# Patient Record
Sex: Female | Born: 1995 | Race: White | Hispanic: No | Marital: Single
Health system: Midwestern US, Academic
[De-identification: ages and names within clinical notes are randomized; demographics above are authoritative.]

## PROBLEM LIST (undated history)

## (undated) DIAGNOSIS — G43909 Migraine, unspecified, not intractable, without status migrainosus: Secondary | ICD-10-CM

## (undated) DIAGNOSIS — R1010 Upper abdominal pain, unspecified: Secondary | ICD-10-CM

## (undated) DIAGNOSIS — Z114 Encounter for screening for human immunodeficiency virus [HIV]: Secondary | ICD-10-CM

## (undated) DIAGNOSIS — Z0000 Encounter for general adult medical examination without abnormal findings: Principal | ICD-10-CM

## (undated) DIAGNOSIS — N3000 Acute cystitis without hematuria: Secondary | ICD-10-CM

## (undated) DIAGNOSIS — Z7689 Persons encountering health services in other specified circumstances: Secondary | ICD-10-CM

## (undated) DIAGNOSIS — Z9229 Personal history of other drug therapy: Secondary | ICD-10-CM

## (undated) DIAGNOSIS — J029 Acute pharyngitis, unspecified: Secondary | ICD-10-CM

## (undated) HISTORY — DX: Migraine, unspecified, not intractable, without status migrainosus: G43.909

## (undated) HISTORY — PX: NO PAST SURGERIES: SHX2092

## (undated) MED FILL — RIMEGEPANT 75 MG DISINTEGRATING TABLET: 75 75 mg | ORAL | 30 days supply | Qty: 8 | Fill #4

## (undated) MED FILL — FREMANEZUMAB-VFRM 225 MG/1.5 ML SUBCUTANEOUS AUTO-INJECTOR: 225 225 mg/1.5 mL | SUBCUTANEOUS | 30 days supply | Qty: 1.5 | Fill #0

## (undated) MED FILL — RIMEGEPANT 75 MG DISINTEGRATING TABLET: 75 75 mg | ORAL | 30 days supply | Qty: 8 | Fill #0

## (undated) MED FILL — FREMANEZUMAB-VFRM 225 MG/1.5 ML SUBCUTANEOUS AUTO-INJECTOR: 225 225 mg/1.5 mL | SUBCUTANEOUS | Qty: 1.5 | Fill #0

---

## 2018-11-30 ENCOUNTER — Other Ambulatory Visit: Payer: Self-pay

## 2018-11-30 ENCOUNTER — Encounter: Payer: Self-pay | Admitting: Family Medicine

## 2018-11-30 ENCOUNTER — Ambulatory Visit (INDEPENDENT_AMBULATORY_CARE_PROVIDER_SITE_OTHER): Payer: 59 | Admitting: Family Medicine

## 2018-11-30 VITALS — BP 118/60 | HR 86 | Wt 155.0 lb

## 2018-11-30 DIAGNOSIS — K625 Hemorrhage of anus and rectum: Secondary | ICD-10-CM | POA: Diagnosis not present

## 2018-11-30 DIAGNOSIS — Z114 Encounter for screening for human immunodeficiency virus [HIV]: Secondary | ICD-10-CM

## 2018-11-30 DIAGNOSIS — Z0000 Encounter for general adult medical examination without abnormal findings: Secondary | ICD-10-CM | POA: Diagnosis not present

## 2018-11-30 NOTE — Patient Instructions (Addendum)
It was great meeting you today!  I am glad things are going well.  In regards to the rectal bleeding I think this is more likely to be from possible hemorrhoids as opposed to an anal fissure.  Due to this  bleeding coupled with normal menstruation I will get a CBC to see if you have anemia.  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  No further testing is necessary at this time.  Please let me know if anything pops up.

## 2018-12-01 LAB — CBC WITH DIFFERENTIAL/PLATELET
Basophils Absolute: 0.1 10*3/uL (ref 0.0–0.2)
Basos: 1 %
EOS (ABSOLUTE): 0.1 10*3/uL (ref 0.0–0.4)
Eos: 2 %
Hematocrit: 37.5 % (ref 34.0–46.6)
Hemoglobin: 12.4 g/dL (ref 11.1–15.9)
Immature Grans (Abs): 0 10*3/uL (ref 0.0–0.1)
Immature Granulocytes: 0 %
Lymphocytes Absolute: 2.4 10*3/uL (ref 0.7–3.1)
Lymphs: 29 %
MCH: 29.1 pg (ref 26.6–33.0)
MCHC: 33.1 g/dL (ref 31.5–35.7)
MCV: 88 fL (ref 79–97)
Monocytes Absolute: 0.6 10*3/uL (ref 0.1–0.9)
Monocytes: 8 %
Neutrophils Absolute: 5 10*3/uL (ref 1.4–7.0)
Neutrophils: 60 %
Platelets: 272 10*3/uL (ref 150–450)
RBC: 4.26 x10E6/uL (ref 3.77–5.28)
RDW: 11.8 % (ref 11.7–15.4)
WBC: 8.2 10*3/uL (ref 3.4–10.8)

## 2018-12-01 LAB — HIV ANTIBODY (ROUTINE TESTING W REFLEX): HIV Screen 4th Generation wRfx: NONREACTIVE

## 2018-12-03 ENCOUNTER — Encounter: Payer: Self-pay | Admitting: Family Medicine

## 2018-12-03 DIAGNOSIS — K625 Hemorrhage of anus and rectum: Secondary | ICD-10-CM | POA: Insufficient documentation

## 2018-12-03 DIAGNOSIS — Z0000 Encounter for general adult medical examination without abnormal findings: Secondary | ICD-10-CM | POA: Insufficient documentation

## 2018-12-03 NOTE — Progress Notes (Signed)
  HPI:  Patient presents today for a new patient appointment to establish general primary care, also to discuss rectal bleeding.  Patient states that she has noticed some small drops of blood a couple of times in her stool over the last 6 months. Has never had painful bowel movements. As far as she knows it is normal consistency and she is not particularly constipated.  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 Cervical lymphadepathy Heart: rrr, no m/r/g. Skin warm and dry, palpable radial pulse bilaterally Lungs: lungs ctab, no accessory muscle use Abdomen: soft, nt, nd. Neuro: cn 2-12 intact, no focal neuro deficits  ASSESSMENT/PLAN:  # Health maintenance:  - getting hiv, will get pap smear at gyn office next week, tdap up to date  Encounter for health supervision hiv negative. Getting pap smear at gyn office. Up to date.  Rectal bleeding Likely secondary to internal hemorrhoids given the non-painful bleeding. Patient has not felt or noticed anything externally either. Encouraged increased water intake and high fiber diet. Cbc normal.     FOLLOW UP: Follow up in 1 year for annual wellness  Guadalupe Dawn MD PGY-3 Family Medicine Resident

## 2018-12-03 NOTE — Assessment & Plan Note (Signed)
hiv negative. Getting pap smear at gyn office. Up to date.

## 2018-12-03 NOTE — Assessment & Plan Note (Signed)
Likely secondary to internal hemorrhoids given the non-painful bleeding. Patient has not felt or noticed anything externally either. Encouraged increased water intake and high fiber diet. Cbc normal.

## 2018-12-04 ENCOUNTER — Telehealth: Payer: Self-pay | Admitting: Family Medicine

## 2018-12-04 NOTE — Telephone Encounter (Signed)
Informed patient of results.  .Maleta Pacha R Maronda Caison, CMA  

## 2018-12-04 NOTE — Telephone Encounter (Signed)
Please let the patient know that her blood counts look great and that her hiv came back negative.  Guadalupe Dawn MD PGY-3 Family Medicine Resident

## 2019-02-12 DIAGNOSIS — G43009 Migraine without aura, not intractable, without status migrainosus: Secondary | ICD-10-CM | POA: Insufficient documentation

## 2019-04-17 ENCOUNTER — Encounter: Payer: Self-pay | Admitting: Family Medicine

## 2019-05-25 ENCOUNTER — Telehealth: Payer: Self-pay | Admitting: *Deleted

## 2019-05-25 DIAGNOSIS — G43809 Other migraine, not intractable, without status migrainosus: Secondary | ICD-10-CM

## 2019-05-25 NOTE — Telephone Encounter (Signed)
Referral placed to [facility]. They should call her to schedule an appointment sometime in the next few weeks.  Myrene Buddy MD PGY-3 Family Medicine Resident

## 2019-05-25 NOTE — Telephone Encounter (Signed)
Pt wants a new referral for a new neurologist. Doesn't see [facility] anymore. Please advise. Alicia Benton Bruna Potter, CMA

## 2019-06-25 ENCOUNTER — Telehealth: Payer: Self-pay | Admitting: Neurology

## 2019-06-25 NOTE — Telephone Encounter (Signed)
Noted thanks °

## 2019-06-25 NOTE — Telephone Encounter (Signed)
I called patient and LVM regarding rescheduling 3/4 appointment due to MD being out of office for a meeting. Requested patient call back to reschedule. FYI

## 2019-07-05 ENCOUNTER — Ambulatory Visit: Payer: 59 | Admitting: Neurology

## 2019-07-15 ENCOUNTER — Encounter: Payer: Self-pay | Admitting: Family Medicine

## 2019-07-20 ENCOUNTER — Encounter: Payer: Self-pay | Admitting: Family Medicine

## 2019-07-20 ENCOUNTER — Other Ambulatory Visit: Payer: Self-pay

## 2019-07-20 ENCOUNTER — Telehealth (INDEPENDENT_AMBULATORY_CARE_PROVIDER_SITE_OTHER): Payer: Managed Care, Other (non HMO) | Admitting: Family Medicine

## 2019-07-20 VITALS — Wt 152.0 lb

## 2019-07-20 DIAGNOSIS — J302 Other seasonal allergic rhinitis: Secondary | ICD-10-CM | POA: Diagnosis not present

## 2019-07-20 DIAGNOSIS — Z9189 Other specified personal risk factors, not elsewhere classified: Secondary | ICD-10-CM | POA: Diagnosis not present

## 2019-07-20 MED ORDER — LEVOCETIRIZINE DIHYDROCHLORIDE 5 MG PO TABS: 5.0000 mg | ORAL_TABLET | Freq: Every evening | ORAL | 0 refills | Status: DC

## 2019-07-20 NOTE — Assessment & Plan Note (Signed)
Patient stop-bang score of 3.  A particularly high risk with his history of perhaps gasping for air while sleeping.  Question if migraine headaches or perhaps due to this issue than a primary cause.  Will refer for sleep study to better characterize.

## 2019-07-20 NOTE — Assessment & Plan Note (Signed)
Can continue taking the Flonase 2 puffs each nostril once daily, for a 200 mcg dose.  We will try Xyzal, sent the prescription into her pharmacy.

## 2019-07-20 NOTE — Progress Notes (Signed)
[facility] Telemedicine Visit  Patient consented to have virtual visit. Method of visit: Telephone  Encounter participants: Patient: Alicia Benton - located at home Provider: Myrene Buddy - located at [facility] Others (if applicable): none  Chief Complaint: Snoring, trouble breathing while sleeping  HPI: 24 year old female presents as a telemedicine visit for several month history of excessive snoring while sleeping.  She states her significant other remarks that she oftentimes will have apparent trouble breathing while sleeping as well.  She is told that she will sometimes wake up, gasping for air.  She does awake with a frequent headache, but this problem is muddied by her history of migraine headaches.  Patient does not have any known hypertension.  Does not have any other risk factors such as a very thick neck, or obesity.  The patient does state that her allergies have been a lot worse recently than usual.  She has had the usual congestion, rhinorrhea, and some sinus pressure.  She has been using Flonase 2 puffs in each nostril once daily, which she has been on for years.  She has tried Claritin in the past but did not like the way that it made her feel.  She states that Zyrtec was really helpful for her in the past, but she thinks that she got used to it and stopped working.  Also feels that she has tried Careers adviser but does not think that that was helpful for her.   ROS: per HPI  Pertinent PMHx: Seasonal allergies  Exam:  General: Very pleasant, no acute distress, comfortable on phone Respiratory: No respiratory distress, able speak in clear coherent sentences without having to stop for breath Psych: Coherent thought process, very pleasant, no tangentiality  Assessment/Plan:  Seasonal allergies Can continue taking the Flonase 2 puffs each nostril once daily, for a 200 mcg dose.  We will try Xyzal, sent the prescription into her pharmacy.  At risk for sleep  apnea Patient stop-bang score of 3.  A particularly high risk with his history of perhaps gasping for air while sleeping.  Question if migraine headaches or perhaps due to this issue than a primary cause.  Will refer for sleep study to better characterize.    Time spent during visit with patient: 16 minutes

## 2019-07-25 NOTE — Progress Notes (Signed)
[facility]    Provider:  Dr Lucia Gaskins Requesting Provider: Doralee Albino MD Primary Care Provider:  Myrene Buddy, MD  CC:  migraines  HPI:  Alicia Benton is a 24 y.o. female here as requested by Dr. Leveda Anna for migraines. I reviewed patient's chart, it appears she may have obstructive sleep apnea, over the last year she has been waking up with headaches, her primary care doctor has referred her for sleep studies.  But she also has a long history of migraine since the age of 49.  She has been managed at [facility] for her migraines and recently moved to [place].  I reviewed her neurology notes, patient is on Maxalt and Zofran for acute management, she has a past medical history of asthma, latent tuberculosis and migraines, Maxalt does resolve her headaches, Phenergan does not appear to help with the headaches but does help with the nausea but makes her sleepy.  She will occasionally have a week where she has approximately 3 migraines clustered together but no changes in headache character, first seen in [facility] in 2019 for a 10-year history of migraines which at that time was gradually worsening in frequency, she has had numbness in her face with migraines and numbness in her arm with migraines just wants, severe nausea and vomiting as well as fatigue.  Right temporal and parietal, neck and shoulder, dull, she has an aura of Sunburst which resolved at age 24 but she did have 2 episodes of unilateral numbness once in the face and once in the arm, 5-6 headache days a month, 1-2 more severe migraine symptoms per month, the last 24 to 48 hours, associated nausea vomiting photophobia phonophobia, triggers include lack of sleep, certain foods, rest helps, severity 2-3 over 10 on average 6/10 at worst, no history of head trauma, she has a history of migraines in mother, maternal aunt, and grandmother.  She is not on any preventative medications.  She is also tried naproxen in the past.  She had an MRI  of the brain in 2010(reviewed report), no focal intraparenchymal signal abnormality or mass lesion.  Started at the age of 72, she has no auras but none since then. She has a non-hormonal IUD. At first her migraines were ibuprofen responsive, she went to [facility] and they tried maxalt and melatonin. Her migraines have progressed, she has migraines that start late in the evening and sometimes she wakes up with a headache. 15/30 days of headaches, she has 8/30 migraine days. Her migraines are on the right side, can be pulsating/pounding and throbbing, lots of nausea, she used to vomit but zofran helps, they can last 24-72 hours, they are moderately severe to severe, sleep helps, dark room helps, movement makes it worse. Stable, no new symptoms, no vision changes, not exertional or positional. Extensive family history of migraines on mom's side.   Reviewed notes, labs and imaging from outside physicians, which showed:  CBC normal 11/2018  See above  Review of Systems: Patient complains of symptoms per HPI as well as the following symptoms: headaches. Pertinent negatives and positives per HPI. All others negative.   Social History   Socioeconomic History  . Marital status: Single    Spouse name: Not on file  . Number of children: Not on file  . Years of education: Not on file  . Highest education level: Bachelor's degree (e.g., BA, AB, BS)  Occupational History  . Not on file  Tobacco Use  . Smoking status: Never Smoker  . Smokeless  tobacco: Never Used  Substance and Sexual Activity  . Alcohol use: Yes    Comment: rarely   . Drug use: Never  . Sexual activity: Not on file    Comment: paraguard IUD  Other Topics Concern  . Not on file  Social History Narrative   Lives with a roommate    Left handed   Caffeine: 1.5 cups/day    Social Determinants of Health   Financial Resource Strain:   . Difficulty of Paying Living Expenses:   Food Insecurity:   . Worried About Programme researcher, broadcasting/film/video  in the Last Year:   . Barista in the Last Year:   Transportation Needs:   . Freight forwarder (Medical):   Marland Kitchen Lack of Transportation (Non-Medical):   Physical Activity:   . Days of Exercise per Week:   . Minutes of Exercise per Session:   Stress:   . Feeling of Stress :   Social Connections:   . Frequency of Communication with Friends and Family:   . Frequency of Social Gatherings with Friends and Family:   . Attends Religious Services:   . Active Member of Clubs or Organizations:   . Attends Banker Meetings:   Marland Kitchen Marital Status:   Intimate Partner Violence:   . Fear of Current or Ex-Partner:   . Emotionally Abused:   Marland Kitchen Physically Abused:   . Sexually Abused:     Family History  Problem Relation Age of Onset  . Migraines Mother   . Thyroid disease Mother        graves  . Hypertension Mother   . Hypertension Father   . Migraines Maternal Grandmother   . Migraines Maternal Aunt   . Migraines Other     Past Medical History:  Diagnosis Date  . Migraines     Patient Active Problem List   Diagnosis Date Noted  . Chronic migraine without aura without status migrainosus, not intractable 07/26/2019  . Migraine with aura and without status migrainosus, not intractable 07/26/2019  . Seasonal allergies 07/20/2019  . At risk for sleep apnea 07/20/2019  . Migraine without aura and without status migrainosus, not intractable 02/12/2019  . Encounter for health supervision 12/03/2018  . Rectal bleeding 12/03/2018    Past Surgical History:  Procedure Laterality Date  . NO PAST SURGERIES      Current Outpatient Medications  Medication Sig Dispense Refill  . levocetirizine (XYZAL) 5 MG tablet Take 1 tablet (5 mg total) by mouth every evening. 30 tablet 0  . ondansetron (ZOFRAN) 4 MG tablet Take 1 tablet (4 mg total) by mouth every 8 (eight) hours as needed for nausea or vomiting. 20 tablet 11  . rizatriptan (MAXALT) 10 MG tablet Take 1 tablet (10 mg  total) by mouth as needed for migraine. May repeat in 2 hours if needed. Max 2 in 24 hours. 10 tablet 11  . nortriptyline (PAMELOR) 10 MG capsule Take 1 capsule (10 mg total) by mouth at bedtime. 30 capsule 3   No current facility-administered medications for this visit.    Allergies as of 07/26/2019 - Review Complete 07/26/2019  Allergen Reaction Noted  . Other Hives 06/09/2015    Vitals: BP 111/66 (BP Location: Right Arm, Patient Position: Sitting)   Pulse 73   Temp (!) 97.4 F (36.3 C) Comment: taken at front  Ht 5\' 7"  (1.702 m)   Wt 155 lb (70.3 kg)   BMI 24.28 kg/m  Last Weight:  Wt Readings  from Last 1 Encounters:  07/26/19 155 lb (70.3 kg)   Last Height:   Ht Readings from Last 1 Encounters:  07/26/19 5\' 7"  (1.702 m)     Physical exam: Exam: Gen: NAD, conversant, well nourised, well groomed                     CV: RRR, no MRG. No Carotid Bruits. No peripheral edema, warm, nontender Eyes: Conjunctivae clear without exudates or hemorrhage  Neuro: Detailed Neurologic Exam  Speech:    Speech is normal; fluent and spontaneous with normal comprehension.  Cognition:    The patient is oriented to person, place, and time;     recent and remote memory intact;     language fluent;     normal attention, concentration,     fund of knowledge Cranial Nerves:    The pupils are equal, round, and reactive to light. The fundi are normal and spontaneous venous pulsations are present. Visual fields are full to finger confrontation. Extraocular movements are intact. Trigeminal sensation is intact and the muscles of mastication are normal. The face is symmetric. The palate elevates in the midline. Hearing intact. Voice is normal. Shoulder shrug is normal. The tongue has normal motion without fasciculations.   Coordination:    No dysmetria  Gait:    Normal native gait  Motor Observation:    No asymmetry, no atrophy, and no involuntary movements noted. Tone:    Normal muscle  tone.    Posture:    Posture is normal. normal erect    Strength:    Strength is V/V in the upper and lower limbs.      Sensation: intact to LT     Reflex Exam:  DTR's:    Deep tendon reflexes in the upper and lower extremities are normal bilaterally.   Toes:    The toes are downgoing bilaterally.   Clonus:    Clonus is absent.    Assessment/Plan:  24 year old with chronic migraines.  - maxalt and zofran acutely - Try nortriptyline preventative  Orders Placed This Encounter  Procedures  . Comprehensive metabolic panel  . TSH   Meds ordered this encounter  Medications  . nortriptyline (PAMELOR) 10 MG capsule    Sig: Take 1 capsule (10 mg total) by mouth at bedtime.    Dispense:  30 capsule    Refill:  3  . ondansetron (ZOFRAN) 4 MG tablet    Sig: Take 1 tablet (4 mg total) by mouth every 8 (eight) hours as needed for nausea or vomiting.    Dispense:  20 tablet    Refill:  11  . rizatriptan (MAXALT) 10 MG tablet    Sig: Take 1 tablet (10 mg total) by mouth as needed for migraine. May repeat in 2 hours if needed. Max 2 in 24 hours.    Dispense:  10 tablet    Refill:  11   Discussed: To prevent or relieve headaches, try the following: Cool Compress. Lie down and place a cool compress on your head.  Avoid headache triggers. If certain foods or odors seem to have triggered your migraines in the past, avoid them. A headache diary might help you identify triggers.  Include physical activity in your daily routine. Try a daily walk or other moderate aerobic exercise.  Manage stress. Find healthy ways to cope with the stressors, such as delegating tasks on your to-do list.  Practice relaxation techniques. Try deep breathing, yoga, massage and visualization.  Eat regularly. Eating regularly scheduled meals and maintaining a healthy diet might help prevent headaches. Also, drink plenty of fluids.  Follow a regular sleep schedule. Sleep deprivation might contribute to  headaches Consider biofeedback. With this mind-body technique, you learn to control certain bodily functions -- such as muscle tension, heart rate and blood pressure -- to prevent headaches or reduce headache pain.    Proceed to emergency room if you experience new or worsening symptoms or symptoms do not resolve, if you have new neurologic symptoms or if headache is severe, or for any concerning symptom.   Provided education and documentation from American headache Society toolbox including articles on: chronic migraine medication overuse headache, chronic migraines, prevention of migraines, behavioral and other nonpharmacologic treatments for headache.  Discussed:  "There is increased risk for stroke in women with migraine with aura and a contraindication for the combined contraceptive pill for use by women who have migraine with aura. The risk for women with migraine without aura is lower. However other risk factors like smoking are far more likely to increase stroke risk than migraine. There is a recommendation for no smoking and for the use of OCPs without estrogen such as progestogen only pills particularly for women with migraine with aura.Marland Kitchen People who have migraine headaches with auras may be 3 times more likely to have a stroke caused by a blood clot, compared to migraine patients who don't see auras. Women who take hormone-replacement therapy may be 30 percent more likely to suffer a clot-based stroke than women not taking medication containing estrogen. Other risk factors like smoking and high blood pressure may be  much more important.  Cc: Moses Manners, MD,  Myrene Buddy, MD  Naomie Dean, MD  [facility] [address] [place], [place] [address]  Phone [phone] Fax [phone]

## 2019-07-26 ENCOUNTER — Other Ambulatory Visit: Payer: Self-pay

## 2019-07-26 ENCOUNTER — Encounter: Payer: Self-pay | Admitting: Neurology

## 2019-07-26 ENCOUNTER — Ambulatory Visit (INDEPENDENT_AMBULATORY_CARE_PROVIDER_SITE_OTHER): Payer: No Typology Code available for payment source | Admitting: Neurology

## 2019-07-26 VITALS — BP 111/66 | HR 73 | Temp 97.4°F | Ht 67.0 in | Wt 155.0 lb

## 2019-07-26 DIAGNOSIS — G43709 Chronic migraine without aura, not intractable, without status migrainosus: Secondary | ICD-10-CM

## 2019-07-26 DIAGNOSIS — G43109 Migraine with aura, not intractable, without status migrainosus: Secondary | ICD-10-CM

## 2019-07-26 MED ORDER — ONDANSETRON HCL 4 MG PO TABS: 4.0000 mg | ORAL_TABLET | Freq: Three times a day (TID) | ORAL | 11 refills | Status: AC | PRN

## 2019-07-26 MED ORDER — NORTRIPTYLINE HCL 10 MG PO CAPS: 10.0000 mg | ORAL_CAPSULE | Freq: Every day | ORAL | 3 refills | Status: DC

## 2019-07-26 MED ORDER — RIZATRIPTAN BENZOATE 10 MG PO TABS: 10.0000 mg | ORAL_TABLET | ORAL | 11 refills | Status: AC | PRN

## 2019-07-26 NOTE — Patient Instructions (Signed)
Acute: Maxalt an Zofran Preventative: Nortriptyline  Nortriptyline capsules What is this medicine? NORTRIPTYLINE (nor TRIP ti leen) is used to treat migraines, nerve pain and other disorders This medicine may be used for other purposes; ask your health care provider or pharmacist if you have questions. COMMON BRAND NAME(S): Aventyl, Pamelor What should I tell my health care provider before I take this medicine? They need to know if you have any of these conditions:  bipolar disorder  Brugada syndrome  difficulty passing urine  glaucoma  heart disease  if you drink alcohol  liver disease  schizophrenia  seizures  suicidal thoughts, plans or attempt; a previous suicide attempt by you or a family member  thyroid disease  an unusual or allergic reaction to nortriptyline, other tricyclic antidepressants, other medicines, foods, dyes, or preservatives  pregnant or trying to get pregnant  breast-feeding How should I use this medicine? Take this medicine by mouth with a glass of water. Follow the directions on the prescription label. Take your doses at regular intervals. Do not take it more often than directed. Do not stop taking this medicine suddenly except upon the advice of your doctor. Stopping this medicine too quickly may cause serious side effects or your condition may worsen. A special MedGuide will be given to you by the pharmacist with each prescription and refill. Be sure to read this information carefully each time. Talk to your pediatrician regarding the use of this medicine in children. Special care may be needed. Overdosage: If you think you have taken too much of this medicine contact a poison control center or emergency room at once. NOTE: This medicine is only for you. Do not share this medicine with others. What if I miss a dose? If you miss a dose, take it as soon as you can. If it is almost time for your next dose, take only that dose. Do not take double or  extra doses. What may interact with this medicine? Do not take this medicine with any of the following medications:  cisapride  dronedarone  linezolid  MAOIs like Carbex, Eldepryl, Marplan, Nardil, and Parnate  methylene blue (injected into a vein)  pimozide  thioridazine This medicine may also interact with the following medications:  alcohol  antihistamines for allergy, cough, and cold  atropine  certain medicines for bladder problems like oxybutynin, tolterodine  certain medicines for depression like amitriptyline, fluoxetine, sertraline  certain medicines for Parkinson's disease like benztropine, trihexyphenidyl  certain medicines for stomach problems like dicyclomine, hyoscyamine  certain medicines for travel sickness like scopolamine  chlorpropamide  cimetidine  ipratropium  other medicines that prolong the QT interval (an abnormal heart rhythm) like dofetilide  other medicines that can cause serotonin syndrome like St. John's Wort, fentanyl, lithium, tramadol, tryptophan, buspirone, and some medicines for headaches like sumatriptan or rizatriptan  quinidine  reserpine  thyroid medicine This list may not describe all possible interactions. Give your health care provider a list of all the medicines, herbs, non-prescription drugs, or dietary supplements you use. Also tell them if you smoke, drink alcohol, or use illegal drugs. Some items may interact with your medicine. What should I watch for while using this medicine? Tell your doctor if your symptoms do not get better or if they get worse. Visit your doctor or health care professional for regular checks on your progress. Because it may take several weeks to see the full effects of this medicine, it is important to continue your treatment as prescribed by your doctor.  Patients and their families should watch out for new or worsening thoughts of suicide or depression. Also watch out for sudden changes in  feelings such as feeling anxious, agitated, panicky, irritable, hostile, aggressive, impulsive, severely restless, overly excited and hyperactive, or not being able to sleep. If this happens, especially at the beginning of treatment or after a change in dose, call your health care professional. Bonita Quin may get drowsy or dizzy. Do not drive, use machinery, or do anything that needs mental alertness until you know how this medicine affects you. Do not stand or sit up quickly, especially if you are an older patient. This reduces the risk of dizzy or fainting spells. Alcohol may interfere with the effect of this medicine. Avoid alcoholic drinks. Do not treat yourself for coughs, colds, or allergies without asking your doctor or health care professional for advice. Some ingredients can increase possible side effects. Your mouth may get dry. Chewing sugarless gum or sucking hard candy, and drinking plenty of water may help. Contact your doctor if the problem does not go away or is severe. This medicine may cause dry eyes and blurred vision. If you wear contact lenses you may feel some discomfort. Lubricating drops may help. See your eye doctor if the problem does not go away or is severe. This medicine can cause constipation. Try to have a bowel movement at least every 2 to 3 days. If you do not have a bowel movement for 3 days, call your doctor or health care professional. This medicine can make you more sensitive to the sun. Keep out of the sun. If you cannot avoid being in the sun, wear protective clothing and use sunscreen. Do not use sun lamps or tanning beds/booths. What side effects may I notice from receiving this medicine? Side effects that you should report to your doctor or health care professional as soon as possible:  allergic reactions like skin rash, itching or hives, swelling of the face, lips, or tongue  anxious  breathing problems  changes in vision  confusion  elevated mood, decreased  need for sleep, racing thoughts, impulsive behavior  eye pain  fast, irregular heartbeat  feeling faint or lightheaded, falls  feeling agitated, angry, or irritable  fever with increased sweating  hallucination, loss of contact with reality  seizures  stiff muscles  suicidal thoughts or other mood changes  tingling, pain, or numbness in the feet or hands  trouble passing urine or change in the amount of urine  trouble sleeping  unusually weak or tired  vomiting  yellowing of the eyes or skin Side effects that usually do not require medical attention (report to your doctor or health care professional if they continue or are bothersome):  change in sex drive or performance  change in appetite or weight  constipation  dizziness  dry mouth  nausea  tired  tremors  upset stomach This list may not describe all possible side effects. Call your doctor for medical advice about side effects. You may report side effects to FDA at 1-800-FDA-1088. Where should I keep my medicine? Keep out of the reach of children. Store at room temperature between 15 and 30 degrees C (59 and 86 degrees F). Keep container tightly closed. Throw away any unused medicine after the expiration date. NOTE: This sheet is a summary. It may not cover all possible information. If you have questions about this medicine, talk to your doctor, pharmacist, or health care provider.  2020 Elsevier/Gold Standard (2018-04-11 13:24:58)

## 2019-07-27 LAB — COMPREHENSIVE METABOLIC PANEL
ALT: 8 IU/L (ref 0–32)
AST: 15 IU/L (ref 0–40)
Albumin/Globulin Ratio: 1.9 (ref 1.2–2.2)
Albumin: 4.4 g/dL (ref 3.9–5.0)
Alkaline Phosphatase: 43 IU/L (ref 39–117)
BUN/Creatinine Ratio: 16 (ref 9–23)
BUN: 11 mg/dL (ref 6–20)
Bilirubin Total: 0.3 mg/dL (ref 0.0–1.2)
CO2: 23 mmol/L (ref 20–29)
Calcium: 9 mg/dL (ref 8.7–10.2)
Chloride: 104 mmol/L (ref 96–106)
Creatinine, Ser: 0.67 mg/dL (ref 0.57–1.00)
GFR calc Af Amer: 144 mL/min/{1.73_m2} (ref >59)
GFR calc non Af Amer: 125 mL/min/{1.73_m2} (ref >59)
Globulin, Total: 2.3 g/dL (ref 1.5–4.5)
Glucose: 80 mg/dL (ref 65–99)
Potassium: 4.2 mmol/L (ref 3.5–5.2)
Sodium: 138 mmol/L (ref 134–144)
Total Protein: 6.7 g/dL (ref 6.0–8.5)

## 2019-07-27 LAB — TSH: TSH: 2.32 u[IU]/mL (ref 0.450–4.500)

## 2019-08-01 ENCOUNTER — Telehealth: Payer: Self-pay | Admitting: Neurology

## 2019-08-01 NOTE — Telephone Encounter (Signed)
Spoke with pt and discussed Dr. Trevor Mace response below to her question. Pt aware it's ok to take both of these. Pt verbalized appreciation and understanding.

## 2019-08-01 NOTE — Telephone Encounter (Signed)
Pt has called asking for a call from RN to discuss concerns about her being on rizatriptan (MAXALT) 10 MG tablet & nortriptyline (PAMELOR) 10 MG capsule.  Pt states she has read that the 2 should not be taken together.  Please call

## 2019-08-01 NOTE — Telephone Encounter (Signed)
We use them together all the time, there are no significant side effects since maxalt is only taken as needed up to 10 times a month.

## 2019-08-08 ENCOUNTER — Other Ambulatory Visit: Payer: Self-pay

## 2019-08-08 ENCOUNTER — Ambulatory Visit (INDEPENDENT_AMBULATORY_CARE_PROVIDER_SITE_OTHER): Payer: No Typology Code available for payment source | Admitting: Neurology

## 2019-08-08 ENCOUNTER — Encounter: Payer: Self-pay | Admitting: Neurology

## 2019-08-08 VITALS — BP 116/69 | HR 86 | Temp 97.6°F | Ht 67.0 in | Wt 157.0 lb

## 2019-08-08 DIAGNOSIS — R0683 Snoring: Secondary | ICD-10-CM | POA: Diagnosis not present

## 2019-08-08 DIAGNOSIS — Z820 Family history of epilepsy and other diseases of the nervous system: Secondary | ICD-10-CM

## 2019-08-08 DIAGNOSIS — R519 Headache, unspecified: Secondary | ICD-10-CM

## 2019-08-08 DIAGNOSIS — R0681 Apnea, not elsewhere classified: Secondary | ICD-10-CM | POA: Diagnosis not present

## 2019-08-08 NOTE — Patient Instructions (Signed)
Here is what we discussed today and what we came up with as our plan for you:    Based on your symptoms and your exam I believe you may be at some risk for obstructive sleep apnea (aka OSA), and I think we should proceed with a sleep study to determine whether you do or do not have OSA and how severe it is. Even, if you have mild OSA, I may want you to consider treatment with CPAP, as treatment of even borderline or mild sleep apnea can result and improvement of symptoms such as sleep disruption, daytime sleepiness, nighttime bathroom breaks, restless leg symptoms, improvement of headache syndromes, even improved mood disorder.   Please remember, the long-term risks and ramifications of untreated moderate to severe obstructive sleep apnea are: increased Cardiovascular disease, including congestive heart failure, stroke, difficult to control hypertension, treatment resistant obesity, arrhythmias, especially irregular heartbeat commonly known as A. Fib. (atrial fibrillation); even type 2 diabetes has been linked to untreated OSA.   Sleep apnea can cause disruption of sleep and sleep deprivation in most cases, which, in turn, can cause recurrent headaches, problems with memory, mood, concentration, focus, and vigilance. Most people with untreated sleep apnea report excessive daytime sleepiness, which can affect their ability to drive. Please do not drive if you feel sleepy. Patients with sleep apnea developed difficulty initiating and maintaining sleep (aka insomnia).   Having sleep apnea may increase your risk for other sleep disorders, including involuntary behaviors sleep such as sleep terrors, sleep talking, sleepwalking.    Having sleep apnea can also increase your risk for restless leg syndrome and leg movements at night.   Please note that untreated obstructive sleep apnea may carry additional perioperative morbidity. Patients with significant obstructive sleep apnea (typically, in the moderate to  severe degree) should receive, if possible, perioperative PAP (positive airway pressure) therapy and the surgeons and particularly the anesthesiologists should be informed of the diagnosis and the severity of the sleep disordered breathing.   I will likely see you back after your sleep study to go over the test results and where to go from there. We will call you after your sleep study to advise about the results (most likely, you will hear from [place], my nurse) and to set up an appointment at the time, as necessary.    Our sleep lab administrative assistant will call you to schedule your sleep study and give you further instructions, regarding the check in process for the sleep study, arrival time, what to bring, when you can expect to leave after the study, etc., and to answer any other logistical questions you may have. If you don't hear back from her by about 2 weeks from now, please feel free to call her direct line at [phone] or you can call our general clinic number, or email [place] through My Chart.

## 2019-08-08 NOTE — Progress Notes (Signed)
Subjective:    Patient ID: Alicia Benton is a 24 y.o. female.  HPI     Huston Foley, MD, PhD [facility] [address] [address] [place], [place] [address]  Dear Dr. Gerilyn Pilgrim,   I saw your patient, Alicia Benton, upon your kind request, in my sleep clinic today for initial consultation of her sleep disorder, in particular, concern for underlying Obstructive sleep apnea. The patient is unaccompanied today. As you know, Alicia Benton is a 24 yo right-handed woman with an underlying medical history of migraine headaches and allergies, who reports snoring, some breathing irregularities while asleep, occasional morning headaches and a family history of OSA.  She lives with a roommate.  She does not have a TV in her bedroom.  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  She is a non-smoker and drinks alcohol very infrequently, caffeine and limitation, 1 cup/day in the mornings typically.  She has seen Dr. Lucia Gaskins recently for migraines.  She was also recently started on allergy medication.  She reports that her mother was diagnosed with mild obstructive sleep apnea and is not on a CPAP machine, her maternal grandmother has a CPAP machine.  Patient reports that sometimes she wakes up with a headache when she goes to bed with a migraine.  She does not have night to night nocturia.  Her boyfriend has noted pauses in her breathing and irregularity in her breathing as well as choking sounds when she is asleep.  She denies any telltale symptoms of palpitations at night, no severe hypersomnolence but does have difficulty waking up first thing in the morning and has always been like this since childhood.  She has vivid dreams at times.  She goes to bed generally between 1030 and 11 and rise time is at 5 twice a week when she swims and otherwise around 7:30 AM.  They have no pets in her household but her boyfriend has a dog.  Her Epworth sleepiness score is 6 out of 24,  fatigue severity score is 12 out of 63.  Her Past Medical History Is Significant For: Past Medical History:  Diagnosis Date  . Migraines     Her Past Surgical History Is Significant For: Past Surgical History:  Procedure Laterality Date  . NO PAST SURGERIES      Her Family History Is Significant For: Family History  Problem Relation Age of Onset  . Migraines Mother   . Thyroid disease Mother        graves  . Hypertension Mother   . Hypertension Father   . Migraines Maternal Grandmother   . Migraines Maternal Aunt   . Migraines Other     Her Social History Is Significant For: Social History   Socioeconomic History  . Marital status: Single    Spouse name: Not on file  . Number of children: Not on file  . Years of education: Not on file  . Highest education level: Bachelor's degree (e.g., BA, AB, BS)  Occupational History  . Not on file  Tobacco Use  . Smoking status: Never Smoker  . Smokeless tobacco: Never Used  Substance and Sexual Activity  . Alcohol use: Yes    Comment: rarely   . Drug use: Never  . Sexual activity: Not on file    Comment: paraguard IUD  Other Topics Concern  . Not on file  Social History Narrative   Lives with a roommate    Left handed  Caffeine: 1.5 cups/day    Social Determinants of Health   Financial Resource Strain:   . Difficulty of Paying Living Expenses:   Food Insecurity:   . Worried About Programme researcher, broadcasting/film/video in the Last Year:   . Barista in the Last Year:   Transportation Needs:   . Freight forwarder (Medical):   Marland Kitchen Lack of Transportation (Non-Medical):   Physical Activity:   . Days of Exercise per Week:   . Minutes of Exercise per Session:   Stress:   . Feeling of Stress :   Social Connections:   . Frequency of Communication with Friends and Family:   . Frequency of Social Gatherings with Friends and Family:   . Attends Religious Services:   . Active Member of Clubs or Organizations:   . Attends Occupational hygienist Meetings:   Marland Kitchen Marital Status:     Her Allergies Are:  Allergies  Allergen Reactions  . Other Hives    peanuts  :   Her Current Medications Are:  Outpatient Encounter Medications as of 08/08/2019  Medication Sig  . levocetirizine (XYZAL) 5 MG tablet Take 1 tablet (5 mg total) by mouth every evening. (Patient not taking: Reported on 08/08/2019)  . nortriptyline (PAMELOR) 10 MG capsule Take 1 capsule (10 mg total) by mouth at bedtime. (Patient not taking: Reported on 08/08/2019)  . ondansetron (ZOFRAN) 4 MG tablet Take 1 tablet (4 mg total) by mouth every 8 (eight) hours as needed for nausea or vomiting. (Patient not taking: Reported on 08/08/2019)  . rizatriptan (MAXALT) 10 MG tablet Take 1 tablet (10 mg total) by mouth as needed for migraine. May repeat in 2 hours if needed. Max 2 in 24 hours. (Patient not taking: Reported on 08/08/2019)   No facility-administered encounter medications on file as of 08/08/2019.  :  Review of Systems:  Out of a complete 14 point review of systems, all are reviewed and negative with the exception of these symptoms as listed below: Review of Systems  Neurological:       Pt presents today because her Boyfriend has told her that she snores in her sleep and he has witnessed her gasping for air. Pt states that her mother and maternal grandma both has osa. She is wanting to complete a work up to see if this applies to her as well. Never had a SS.  Epworth Sleepiness Scale 0= would never doze 1= slight chance of dozing 2= moderate chance of dozing 3= high chance of dozing  Sitting and reading:1 Watching TV:1 Sitting inactive in a public place (ex. Theater or meeting):0 As a passenger in a car for an hour without a break:2 Lying down to rest in the afternoon:2 Sitting and talking to someone:0 Sitting quietly after lunch (no alcohol):0 In a car, while stopped in traffic:0 Total:6     Objective:  Neurological Exam  Physical Exam Physical  Examination:   Vitals:   08/08/19 0746  BP: 116/69  Pulse: 86  Temp: 97.6 F (36.4 C)    General Examination: The patient is a very pleasant 24 y.o. female in no acute distress. She appears well-developed and well-nourished and well groomed.   HEENT: Normocephalic, atraumatic, pupils are equal, round and reactive to light, extraocular tracking is good without limitation to gaze excursion or nystagmus noted. Hearing is grossly intact. Face is symmetric with normal facial animation. Speech is clear with no dysarthria noted. There is no hypophonia. There is no lip, neck/head,  jaw or voice tremor. Neck is supple with full range of passive and active motion. There are no carotid bruits on auscultation. Oropharynx exam reveals: no signif. mouth dryness, good dental hygiene and mild airway crowding, due to tonsils of 1-2+. Mallampati is class I. Tongue protrudes centrally and palate elevates symmetrically. Neck size is 13 1/8 inches. She has a minimal overbite.  Chest: Clear to auscultation without wheezing, rhonchi or crackles noted.  Heart: S1+S2+0, regular and normal without murmurs, rubs or gallops noted.   Abdomen: Soft, non-tender and non-distended with normal bowel sounds appreciated on auscultation.  Extremities: There is no pitting edema in the distal lower extremities bilaterally.   Skin: Warm and dry without trophic changes noted.   Musculoskeletal: exam reveals no obvious joint deformities, tenderness or joint swelling or erythema.   Neurologically:  Mental status: The patient is awake, alert and oriented in all 4 spheres. Her immediate and remote memory, attention, language skills and fund of knowledge are appropriate. There is no evidence of aphasia, agnosia, apraxia or anomia. Speech is clear with normal prosody and enunciation. Thought process is linear. Mood is normal and affect is normal.  Cranial nerves II - XII are as described above under HEENT exam.  Motor exam: Normal  bulk, strength and tone is noted. There is no tremor, Romberg is negative. Fine motor skills and coordination: grossly intact.  Cerebellar testing: No dysmetria or intention tremor. There is no truncal or gait ataxia.  Sensory exam: intact to light touch in the upper and lower extremities.  Gait, station and balance: She stands easily. No veering to one side is noted. No leaning to one side is noted. Posture is age-appropriate and stance is narrow based. Gait shows normal stride length and normal pace. No problems turning are noted. Tandem walk is unremarkable.                Assessment and Plan:   In summary, Alicia Benton is a very pleasant 24 y.o.-year old female with an underlying medical history of migraine headaches and allergies, whose history and physical exam are concerning for obstructive sleep apnea (OSA). I had a long chat with the patient about my findings and the diagnosis of OSA, its prognosis and treatment options. We talked about medical treatments, surgical interventions and non-pharmacological approaches. I explained in particular the risks and ramifications of untreated moderate to severe OSA, especially with respect to developing cardiovascular disease down the Road, including congestive heart failure, difficult to treat hypertension, cardiac arrhythmias, or stroke. Even type 2 diabetes has, in part, been linked to untreated OSA. Symptoms of untreated OSA include daytime sleepiness, memory problems, mood irritability and mood disorder such as depression and anxiety, lack of energy, as well as recurrent headaches, especially morning headaches. We talked about trying to maintain a healthy lifestyle in general, as well as the importance of weight control. We also talked about the importance of good sleep hygiene. I recommended the following at this time: sleep study.   I explained the sleep test procedure to the patient and also outlined possible surgical and non-surgical treatment options  of OSA, including the use of a custom-made dental device (which would require a referral to a specialist dentist or oral surgeon), upper airway surgical options, such as traditional UPPP or a novel less invasive surgical option in the form of Inspire hypoglossal nerve stimulation (which would involve a referral to an ENT surgeon). I also explained the CPAP treatment option to the patient, who indicated that  she would be willing to try CPAP if the need arises. I explained the importance of being compliant with PAP treatment, not only for insurance purposes but primarily to improve Her symptoms, and for the patient's long term health benefit, including to reduce Her cardiovascular risks. I answered all her questions today and the patient was in agreement. I plan to see her back after the sleep study is completed and encouraged her to call with any interim questions, concerns, problems or updates.   Thank you very much for allowing me to participate in the care of this nice patient. If I can be of any further assistance to you please do not hesitate to call me at [phone].  Sincerely,   Huston Foley, MD, PhD

## 2019-08-13 ENCOUNTER — Telehealth: Payer: Self-pay | Admitting: Neurology

## 2019-08-13 NOTE — Telephone Encounter (Signed)
Patient reported a 6 day headache , non-responsive to XYZ-triptan and other preventive meds as well. I asked her to go to the ER as she had significant Nausea and was not able to function. CD

## 2019-08-16 ENCOUNTER — Encounter: Payer: Self-pay | Admitting: Family Medicine

## 2019-08-16 ENCOUNTER — Other Ambulatory Visit: Payer: Self-pay | Admitting: Family Medicine

## 2019-08-16 MED ORDER — LEVOCETIRIZINE DIHYDROCHLORIDE 5 MG PO TABS: 5.0000 mg | ORAL_TABLET | Freq: Every evening | ORAL | 0 refills | Status: DC

## 2019-09-13 ENCOUNTER — Other Ambulatory Visit: Payer: Self-pay

## 2019-09-13 ENCOUNTER — Ambulatory Visit (INDEPENDENT_AMBULATORY_CARE_PROVIDER_SITE_OTHER): Payer: No Typology Code available for payment source | Admitting: Neurology

## 2019-09-13 DIAGNOSIS — G4733 Obstructive sleep apnea (adult) (pediatric): Secondary | ICD-10-CM | POA: Diagnosis not present

## 2019-09-13 DIAGNOSIS — R519 Headache, unspecified: Secondary | ICD-10-CM

## 2019-09-13 DIAGNOSIS — G4720 Circadian rhythm sleep disorder, unspecified type: Secondary | ICD-10-CM

## 2019-09-13 DIAGNOSIS — R0683 Snoring: Secondary | ICD-10-CM

## 2019-09-13 DIAGNOSIS — R0681 Apnea, not elsewhere classified: Secondary | ICD-10-CM

## 2019-09-13 DIAGNOSIS — Z820 Family history of epilepsy and other diseases of the nervous system: Secondary | ICD-10-CM

## 2019-09-26 ENCOUNTER — Telehealth: Payer: Self-pay

## 2019-09-26 NOTE — Progress Notes (Signed)
Patient referred by Dr. Primitivo Gauze, seen by me on 08/08/19, diagnostic PSG on 09/13/19.   Please call and notify the patient that the recent sleep study did not show any significant obstructive sleep apnea; she had mild to moderate snoring, more noticed when she slept on her back. She does not need to be treated for sleep apnea. Avoiding sleeping on the back will likely reduce her snoring. She slept fairly well, achieved all stages of sleep.  She can FU with her PCP as scheduled.

## 2019-09-26 NOTE — Procedures (Signed)
PATIENT'S NAME:  Alicia, Benton DOB:      Aug 06, 1995      MR#:    [id number]     DATE OF RECORDING: 09/13/2019 REFERRING M.D.:  Myrene Buddy, MD Study Performed:   Baseline Polysomnogram HISTORY: 24 year old woman with a history of migraine headaches and allergies, who reports snoring, some breathing irregularities while asleep, occasional morning headaches and a family history of OSA. The patient endorsed the Epworth Sleepiness Scale at 6 points. The patient's weight 157 pounds with a height of 67 (inches), resulting in a BMI of 24.6 kg/m2. The patient's neck circumference measured 13 inches.  CURRENT MEDICATIONS: Xyzal, Pamelor, Zofran,Maxalt   PROCEDURE:  This is a multichannel digital polysomnogram utilizing the Somnostar 11.2 system.  Electrodes and sensors were applied and monitored per AASM Specifications.   EEG, EOG, Chin and Limb EMG, were sampled at 200 Hz.  ECG, Snore and Nasal Pressure, Thermal Airflow, Respiratory Effort, CPAP Flow and Pressure, Oximetry was sampled at 50 Hz. Digital video and audio were recorded.      BASELINE STUDY  Lights Out was at 22:00 and Lights On at 05:00.  Total recording time (TRT) was 421 minutes, with a total sleep time (TST) of 379 minutes.   The patient's sleep latency was 18.5 minutes.  REM latency was 106 minutes, which is high normal. The sleep efficiency was 90%.     SLEEP ARCHITECTURE: WASO (Wake after sleep onset) was 23.5 minutes with minimal to mild sleep fragmentation noted. There were 5 minutes in Stage N1, 211.5 minutes Stage N2, 98 minutes Stage N3 and 64.5 minutes in Stage REM.  The percentage of Stage N1 was 1.3%, Stage N2 was 55.8%, Stage N3 was 25.9% and Stage R (REM sleep) was 17.%, which is mildly reduced. The arousals were noted as: 49 were spontaneous, 0 were associated with PLMs, 0 were associated with respiratory events.  RESPIRATORY ANALYSIS:  There were a total of 0 respiratory events:  0 obstructive apneas, 0 central apneas and 0  mixed apneas with a total of 0 apneas and an apnea index (AI) of 0 /hour. There were 0 hypopneas with a hypopnea index of 0 /hour. The patient also had 0 respiratory event related arousals (RERAs).      The total APNEA/HYPOPNEA INDEX (AHI) was 0/hour and the total RESPIRATORY DISTURBANCE INDEX was  0 /hour.  0 events occurred in REM sleep and 0 events in NREM. The REM AHI was  0 /hour, versus a non-REM AHI of 0. The patient spent 228.5 minutes of total sleep time in the supine position and 151 minutes in non-supine.. The supine AHI was 0.0 versus a non-supine AHI of 0.0.  OXYGEN SATURATION & C02:  The Wake baseline 02 saturation was 95%, with the lowest being 92%. Time spent below 89% saturation equaled 0 minutes.  PERIODIC LIMB MOVEMENTS: The patient had a total of 0 Periodic Limb Movements.  The Periodic Limb Movement (PLM) index was 0 and the PLM Arousal index was 0/hour. Audio and video analysis did not show any abnormal or unusual movements, behaviors, phonations or vocalizations. Intermittent mild to moderate snoring was noted. The EKG was in keeping with normal sinus rhythm (NSR).  Post-study, the patient indicated that sleep was the same or mildly worse than usual.   IMPRESSION:  1. Primary Snoring 2. Dysfunctions associated with sleep stages or arousal from sleep  RECOMMENDATIONS:  1. This study does not demonstrate any significant obstructive or central sleep disordered breathing with the  exception of intermittent, mild to moderate snoring. Avoiding the supine sleep position may help the snoring. This study does not support an intrinsic sleep disorder as a cause of the patient's symptoms. Other causes, including circadian rhythm disturbances, an underlying mood disorder, medication effect and/or an underlying medical problem cannot be ruled out. 2. This study shows some sleep fragmentation and mildly abnormal sleep stage percentages; these are nonspecific findings and per se do not  signify an intrinsic sleep disorder or a cause for the patient's sleep-related symptoms. Causes include (but are not limited to) the first night effect of the sleep study, circadian rhythm disturbances, medication effect or an underlying mood disorder or medical problem.  3. The patient should be cautioned not to drive, work at heights, or operate dangerous or heavy equipment when tired or sleepy. Review and reiteration of good sleep hygiene measures should be pursued with any patient. 4. The patient will be advised to follow up with the referring provider, who will be notified of the test results.  I certify that I have reviewed the entire raw data recording prior to the issuance of this report in accordance with the Standards of Accreditation of the American Academy of Sleep Medicine (AASM)  Star Age, MD, PhD Diplomat, American Board of Neurology and Sleep Medicine (Neurology and Sleep Medicine)

## 2019-09-26 NOTE — Telephone Encounter (Signed)
I reached out to the pt and advised of the result. Pt verbalized understanding and had no questions/concerns.

## 2019-09-26 NOTE — Telephone Encounter (Signed)
-----   Message from Huston Foley, MD sent at 09/26/2019 10:39 AM EDT ----- Patient referred by Dr. Primitivo Gauze, seen by me on 08/08/19, diagnostic PSG on 09/13/19.   Please call and notify the patient that the recent sleep study did not show any significant obstructive sleep apnea; she had mild to moderate snoring, more noticed when she slept on her back. She does not need to be treated for sleep apnea. Avoiding sleeping on the back will likely reduce her snoring. She slept fairly well, achieved all stages of sleep.  She can FU with her PCP as scheduled.

## 2019-11-19 ENCOUNTER — Other Ambulatory Visit: Payer: Self-pay | Admitting: Neurology

## 2019-11-20 ENCOUNTER — Other Ambulatory Visit: Payer: Self-pay

## 2019-11-22 MED ORDER — LEVOCETIRIZINE DIHYDROCHLORIDE 5 MG PO TABS: 5.0000 mg | ORAL_TABLET | Freq: Every evening | ORAL | 1 refills | Status: DC

## 2019-11-22 NOTE — Telephone Encounter (Signed)
I called patient and she notified me that she has transitioned her care to [facility]. She not longer wants to have our clinic as her PCP. 

## 2019-11-22 NOTE — Telephone Encounter (Signed)
Please disregard the last message. That was entered in error.

## 2019-12-04 ENCOUNTER — Ambulatory Visit: Payer: No Typology Code available for payment source | Admitting: Neurology

## 2019-12-04 ENCOUNTER — Other Ambulatory Visit: Payer: Self-pay | Admitting: Neurology

## 2019-12-04 MED ORDER — NORTRIPTYLINE HCL 10 MG PO CAPS: 20.0000 mg | ORAL_CAPSULE | Freq: Every day | ORAL | 3 refills | Status: DC

## 2019-12-04 NOTE — Progress Notes (Signed)
Meds ordered this encounter  Medications   nortriptyline (PAMELOR) 10 MG capsule    Sig: Take 2 capsules (20 mg total) by mouth at bedtime.    Dispense:  60 capsule    Refill:  3

## 2020-01-09 ENCOUNTER — Ambulatory Visit: Payer: No Typology Code available for payment source | Admitting: Neurology

## 2020-01-10 ENCOUNTER — Encounter: Payer: Self-pay | Admitting: Neurology

## 2020-01-10 ENCOUNTER — Other Ambulatory Visit: Payer: Self-pay

## 2020-01-10 ENCOUNTER — Ambulatory Visit (INDEPENDENT_AMBULATORY_CARE_PROVIDER_SITE_OTHER): Payer: No Typology Code available for payment source | Admitting: Neurology

## 2020-01-10 VITALS — BP 107/62 | HR 69 | Ht 67.0 in | Wt 156.0 lb

## 2020-01-10 DIAGNOSIS — G43709 Chronic migraine without aura, not intractable, without status migrainosus: Secondary | ICD-10-CM

## 2020-01-10 MED ORDER — FREMANEZUMAB-VFRM 225 MG/1.5ML ~~LOC~~ SOSY: 225.0000 mg | PREFILLED_SYRINGE | Freq: Once | SUBCUTANEOUS | Status: DC

## 2020-01-10 MED ORDER — NARATRIPTAN HCL 2.5 MG PO TABS: 2.5000 mg | ORAL_TABLET | ORAL | 11 refills | Status: DC | PRN

## 2020-01-10 MED ORDER — AJOVY 225 MG/1.5ML ~~LOC~~ SOAJ: 225.0000 mg | SUBCUTANEOUS | 11 refills | Status: AC

## 2020-01-10 NOTE — Progress Notes (Signed)
[facility]    Provider:  Dr Lucia Gaskins Requesting Provider: Doralee Albino MD Primary Care Provider:  Milus Height, PA  CC:  migraines  Interval history 01/10/2020: The diagnostic PSG on 09/13/2019 did not show any significant OSA. She was previously managed by [facility] for her headaches and this is the second time I m seeing her, she had a sleep evaluation in our office in May. Reviewed sleep evaluation data and agree with findings, moderate snoring mostly on her back. On nortriptyline and maxalt and zofran. With the Nortriptyline she has 8 migraine days a month, she also has other headaches. maxalt doesn't work that well. A lot of times it worse well then it does not work. Discussed MRi of the brain, at this time will hold off. Discussed starting Ajovy and injected her today, also changed her acute medication.Continue Nortriptyline.Do not get pregnant on Ajovy, discussed.  HPI:  Cristella Stiver is a 24 y.o. female here as requested by Dr. Leveda Anna for migraines. I reviewed patient's chart, it appears she may have obstructive sleep apnea, over the last year she has been waking up with headaches, her primary care doctor has referred her for sleep studies.  But she also has a long history of migraine since the age of 61.  She has been managed at [facility] for her migraines and recently moved to [place].  I reviewed her neurology notes, patient is on Maxalt and Zofran for acute management, she has a past medical history of asthma, latent tuberculosis and migraines, Maxalt does resolve her headaches, Phenergan does not appear to help with the headaches but does help with the nausea but makes her sleepy.  She will occasionally have a week where she has approximately 3 migraines clustered together but no changes in headache character, first seen in [facility] in 2019 for a 10-year history of migraines which at that time was gradually worsening in frequency, she has had numbness in her face with migraines and  numbness in her arm with migraines just wants, severe nausea and vomiting as well as fatigue.  Right temporal and parietal, neck and shoulder, dull, she has an aura of Sunburst which resolved at age 64 but she did have 2 episodes of unilateral numbness once in the face and once in the arm, 5-6 headache days a month, 1-2 more severe migraine symptoms per month, the last 24 to 48 hours, associated nausea vomiting photophobia phonophobia, triggers include lack of sleep, certain foods, rest helps, severity 2-3 over 10 on average 6/10 at worst, no history of head trauma, she has a history of migraines in mother, maternal aunt, and grandmother.  She is not on any preventative medications.  She is also tried naproxen in the past.  She had an MRI of the brain in 2010(reviewed report), no focal intraparenchymal signal abnormality or mass lesion.she has migraines that start late in the evening and sometimes she wakes up with a headache. 15/30 days of headaches, she has 8/30 migraine days. Her migraines are on the right side, can be pulsating/pounding and throbbing, lots of nausea, she used to vomit but zofran helps, they can last 24-72 hours, they are moderately severe to severe, sleep helps, dark room helps, movement makes it worse. Stable, no new symptoms, no vision changes, not exertional or positional. Extensive family history of migraines on mom's side.    Started at the age of 50, she has no auras but none since then. She has a non-hormonal IUD. At first her migraines were ibuprofen responsive,  she went to [facility] and they tried maxalt and melatonin. Her migraines have progressed, she has migraines that start late in the evening and sometimes she wakes up with a headache. 15/30 days of headaches, she has 8/30 migraine days. Her migraines are on the right side, can be pulsating/pounding and throbbing, lots of nausea, she used to vomit but zofran helps, they can last 24-72 hours, they are moderately severe to severe,  sleep helps, dark room helps, movement makes it worse. Stable, no new symptoms, no vision changes, not exertional or positional. Extensive family history of migraines on mom's side.   Reviewed notes, labs and imaging from outside physicians, which showed:  CBC normal 11/2018  See above  Review of Systems: Patient complains of symptoms per HPI as well as the following symptoms: migraines and headaches . Pertinent negatives and positives per HPI. All others negative    Social History   Socioeconomic History  . Marital status: Single    Spouse name: Not on file  . Number of children: Not on file  . Years of education: Not on file  . Highest education level: Bachelor's degree (e.g., BA, AB, BS)  Occupational History  . Not on file  Tobacco Use  . Smoking status: Never Smoker  . Smokeless tobacco: Never Used  Vaping Use  . Vaping Use: Never used  Substance and Sexual Activity  . Alcohol use: Yes    Comment: rarely   . Drug use: Never  . Sexual activity: Not on file    Comment: paraguard IUD  Other Topics Concern  . Not on file  Social History Narrative   Lives with a roommate    Left handed   Caffeine: 1.5 cups/day    Social Determinants of Health   Financial Resource Strain:   . Difficulty of Paying Living Expenses: Not on file  Food Insecurity:   . Worried About Programme researcher, broadcasting/film/video in the Last Year: Not on file  . Ran Out of Food in the Last Year: Not on file  Transportation Needs:   . Lack of Transportation (Medical): Not on file  . Lack of Transportation (Non-Medical): Not on file  Physical Activity:   . Days of Exercise per Week: Not on file  . Minutes of Exercise per Session: Not on file  Stress:   . Feeling of Stress : Not on file  Social Connections:   . Frequency of Communication with Friends and Family: Not on file  . Frequency of Social Gatherings with Friends and Family: Not on file  . Attends Religious Services: Not on file  . Active Member of Clubs  or Organizations: Not on file  . Attends Banker Meetings: Not on file  . Marital Status: Not on file  Intimate Partner Violence:   . Fear of Current or Ex-Partner: Not on file  . Emotionally Abused: Not on file  . Physically Abused: Not on file  . Sexually Abused: Not on file    Family History  Problem Relation Age of Onset  . Migraines Mother   . Thyroid disease Mother        graves  . Hypertension Mother   . Hypertension Father   . Migraines Maternal Grandmother   . Migraines Maternal Aunt   . Migraines Other     Past Medical History:  Diagnosis Date  . Migraines     Patient Active Problem List   Diagnosis Date Noted  . Chronic migraine without aura without status migrainosus, not  intractable 07/26/2019  . Migraine with aura and without status migrainosus, not intractable 07/26/2019  . Seasonal allergies 07/20/2019  . At risk for sleep apnea 07/20/2019  . Migraine without aura and without status migrainosus, not intractable 02/12/2019  . Encounter for health supervision 12/03/2018  . Rectal bleeding 12/03/2018    Past Surgical History:  Procedure Laterality Date  . NO PAST SURGERIES      Current Outpatient Medications  Medication Sig Dispense Refill  . levocetirizine (XYZAL) 5 MG tablet Take 1 tablet (5 mg total) by mouth every evening. 90 tablet 1  . nortriptyline (PAMELOR) 10 MG capsule Take 2 capsules (20 mg total) by mouth at bedtime. 60 capsule 3  . ondansetron (ZOFRAN) 4 MG tablet Take 1 tablet (4 mg total) by mouth every 8 (eight) hours as needed for nausea or vomiting. 20 tablet 11  . rizatriptan (MAXALT) 10 MG tablet Take 1 tablet (10 mg total) by mouth as needed for migraine. May repeat in 2 hours if needed. Max 2 in 24 hours. 10 tablet 11  . Fremanezumab-vfrm (AJOVY) 225 MG/1.5ML SOAJ Inject 225 mg into the skin every 30 (thirty) days. 4.5 mL 11  . naratriptan (AMERGE) 2.5 MG tablet Take 1 tablet (2.5 mg total) by mouth as needed for  migraine. Take one (1) tablet at onset of headache; if returns or does not resolve, may repeat after 2 hours; do not exceed five (5) mg in 24 hours. 9 tablet 11   Current Facility-Administered Medications  Medication Dose Route Frequency Provider Last Rate Last Admin  . Fremanezumab-vfrm SOSY 225 mg  225 mg Subcutaneous Once Anson Fret, MD        Allergies as of 01/10/2020 - Review Complete 01/10/2020  Allergen Reaction Noted  . Other Hives 06/09/2015    Vitals: BP 107/62 (BP Location: Right Arm, Patient Position: Sitting, Cuff Size: Normal)   Pulse 69   Ht 5\' 7"  (1.702 m)   Wt 156 lb (70.8 kg)   BMI 24.43 kg/m  Last Weight:  Wt Readings from Last 1 Encounters:  01/10/20 156 lb (70.8 kg)   Last Height:   Ht Readings from Last 1 Encounters:  01/10/20 5\' 7"  (1.702 m)   Physical exam: Exam: Gen: NAD, conversant, well nourised, well groomed                     CV: RRR, no MRG. No Carotid Bruits. No peripheral edema, warm, nontender Eyes: Conjunctivae clear without exudates or hemorrhage  Neuro: Detailed Neurologic Exam  Speech:    Speech is normal; fluent and spontaneous with normal comprehension.  Cognition:    The patient is oriented to person, place, and time;     recent and remote memory intact;     language fluent;     normal attention, concentration,     fund of knowledge Cranial Nerves:    The pupils are equal, round, and reactive to light. The fundi are normal and spontaneous venous pulsations are present. Visual fields are full to finger confrontation. Extraocular movements are intact. Trigeminal sensation is intact and the muscles of mastication are normal. The face is symmetric. The palate elevates in the midline. Hearing intact. Voice is normal. Shoulder shrug is normal. The tongue has normal motion without fasciculations.   Coordination:    Normal finger to nose and heel to shin. Normal rapid alternating movements.   Gait:    Heel-toe and tandem gait  are normal.   Motor Observation:  No asymmetry, no atrophy, and no involuntary movements noted. Tone:    Normal muscle tone.    Posture:    Posture is normal. normal erect    Strength:    Strength is V/V in the upper and lower limbs.      Sensation: intact to LT     Reflex Exam:  DTR's:    Deep tendon reflexes in the upper and lower extremities are normal bilaterally.   Toes:    The toes are downgoing bilaterally.   Clonus:    Clonus is absent.    Assessment/Plan:  24 year old with chronic migraines. Prior auras, none now, sleep eval negative for OSA, still struggling, discussed options, change maxalt to Amerge, start Ajovy  - Start Ajovy, injected in the office, explained the copay card  - Amerge, Ibuprofen 800mg  and zofran acutely - Conitnue nortriptyline preventative, helps her sleep - Start Ajovy - She declined YTopamax, due to the side effects   Meds ordered this encounter  Medications  . Fremanezumab-vfrm (AJOVY) 225 MG/1.5ML SOAJ    Sig: Inject 225 mg into the skin every 30 (thirty) days.    Dispense:  4.5 mL    Refill:  11    Patient has copay card; she can have medication regardless of insurance approval or copay amount.This is a 3714-month supply.  . naratriptan (AMERGE) 2.5 MG tablet    Sig: Take 1 tablet (2.5 mg total) by mouth as needed for migraine. Take one (1) tablet at onset of headache; if returns or does not resolve, may repeat after 2 hours; do not exceed five (5) mg in 24 hours.    Dispense:  9 tablet    Refill:  11  . Fremanezumab-vfrm SOSY 225 mg   Discussed: To prevent or relieve headaches, try the following: Cool Compress. Lie down and place a cool compress on your head.  Avoid headache triggers. If certain foods or odors seem to have triggered your migraines in the past, avoid them. A headache diary might help you identify triggers.  Include physical activity in your daily routine. Try a daily walk or other moderate aerobic exercise.   Manage stress. Find healthy ways to cope with the stressors, such as delegating tasks on your to-do list.  Practice relaxation techniques. Try deep breathing, yoga, massage and visualization.  Eat regularly. Eating regularly scheduled meals and maintaining a healthy diet might help prevent headaches. Also, drink plenty of fluids.  Follow a regular sleep schedule. Sleep deprivation might contribute to headaches Consider biofeedback. With this mind-body technique, you learn to control certain bodily functions -- such as muscle tension, heart rate and blood pressure -- to prevent headaches or reduce headache pain.    Proceed to emergency room if you experience new or worsening symptoms or symptoms do not resolve, if you have new neurologic symptoms or if headache is severe, or for any concerning symptom.   Provided education and documentation from American headache Society toolbox including articles on: chronic migraine medication overuse headache, chronic migraines, prevention of migraines, behavioral and other nonpharmacologic treatments for headache.  Discussed:  "There is increased risk for stroke in women with migraine with aura and a contraindication for the combined contraceptive pill for use by women who have migraine with aura. The risk for women with migraine without aura is lower. However other risk factors like smoking are far more likely to increase stroke risk than migraine. There is a recommendation for no smoking and for the use of OCPs without  estrogen such as progestogen only pills particularly for women with migraine with aura.Marland Kitchen People who have migraine headaches with auras may be 3 times more likely to have a stroke caused by a blood clot, compared to migraine patients who don't see auras. Women who take hormone-replacement therapy may be 30 percent more likely to suffer a clot-based stroke than women not taking medication containing estrogen. Other risk factors like smoking and high  blood pressure may be  much more important.  Cc: Myrene Buddy, MD,  [place], [place], [place]   I spent 45 minutes of face-to-face and non-face-to-face time with patient on the  1. Chronic migraine without aura without status migrainosus, not intractable    diagnosis.  This included previsit chart review, lab review, study review, order entry, electronic health record documentation, patient education on the different diagnostic and therapeutic options, counseling and coordination of care, risks and benefits of management, compliance, or risk factor reduction   Naomie Dean, MD  [facility] [address] [place], [place] [address]  Phone [phone] Fax [phone]

## 2020-01-10 NOTE — Patient Instructions (Signed)
Fremanezumab injection What is this medicine? FREMANEZUMAB (fre ma NEZ ue mab) is used to prevent migraine headaches. This medicine may be used for other purposes; ask your health care provider or pharmacist if you have questions. COMMON BRAND NAME(S): AJOVY What should I tell my health care provider before I take this medicine? They need to know if you have any of these conditions:  an unusual or allergic reaction to fremanezumab, other medicines, foods, dyes, or preservatives  pregnant or trying to get pregnant  breast-feeding How should I use this medicine? This medicine is for injection under the skin. You will be taught how to prepare and give this medicine. Use exactly as directed. Take your medicine at regular intervals. Do not take your medicine more often than directed. It is important that you put your used needles and syringes in a special sharps container. Do not put them in a trash can. If you do not have a sharps container, call your pharmacist or healthcare provider to get one. Talk to your pediatrician regarding the use of this medicine in children. Special care may be needed. Overdosage: If you think you have taken too much of this medicine contact a poison control center or emergency room at once. NOTE: This medicine is only for you. Do not share this medicine with others. What if I miss a dose? If you miss a dose, take it as soon as you can. If it is almost time for your next dose, take only that dose. Do not take double or extra doses. What may interact with this medicine? Interactions are not expected. This list may not describe all possible interactions. Give your health care provider a list of all the medicines, herbs, non-prescription drugs, or dietary supplements you use. Also tell them if you smoke, drink alcohol, or use illegal drugs. Some items may interact with your medicine. What should I watch for while using this medicine? Tell your doctor or healthcare  professional if your symptoms do not start to get better or if they get worse. What side effects may I notice from receiving this medicine? Side effects that you should report to your doctor or health care professional as soon as possible:  allergic reactions like skin rash, itching or hives, swelling of the face, lips, or tongue Side effects that usually do not require medical attention (report these to your doctor or health care professional if they continue or are bothersome):  pain, redness, or irritation at site where injected This list may not describe all possible side effects. Call your doctor for medical advice about side effects. You may report side effects to FDA at 1-800-FDA-1088. Where should I keep my medicine? Keep out of the reach of children. You will be instructed on how to store this medicine. Throw away any unused medicine after the expiration date on the label. NOTE: This sheet is a summary. It may not cover all possible information. If you have questions about this medicine, talk to your doctor, pharmacist, or health care provider.  2020 Elsevier/Gold Standard (2017-01-17 17:22:56) Naratriptan tablets What is this medicine? NARATRIPTAN (NAR a trip tan) is used to treat migraines with or without aura. An aura is a strange feeling or visual disturbance that warns you of an attack. It is not used to prevent migraines. This medicine may be used for other purposes; ask your health care provider or pharmacist if you have questions. COMMON BRAND NAME(S): Amerge What should I tell my health care provider before I take this  medicine? They need to know if you have any of these conditions:  cigarette smoker  circulation problems in fingers and toes  diabetes  heart disease  high blood pressure  high cholesterol  history of irregular heartbeat  history of stroke  kidney disease  liver disease  stomach or intestine problems  an unusual or allergic reaction to  naratriptan, other medicines, foods, dyes, or preservatives  pregnant or trying to get pregnant  breast-feeding How should I use this medicine? Take this medicine by mouth with a glass of water. Follow the directions on the prescription label. Do not take it more often than directed. Talk to your pediatrician regarding the use of this medicine in children. Special care may be needed. Overdosage: If you think you have taken too much of this medicine contact a poison control center or emergency room at once. NOTE: This medicine is only for you. Do not share this medicine with others. What if I miss a dose? This does not apply. This medicine is not for regular use. What may interact with this medicine? Do not take this medicine with any of the following medicines:  ergot alkaloids like dihydroergotamine, ergonovine, ergotamine, methylergonovine  certain medicines for migraine headache like almotriptan, eletriptan, frovatriptan, naratriptan, rizatriptan, sumatriptan, zolmitriptan This medicine may also interact with the following medications:  certain medicines for depression, anxiety, or psychotic disorders This list may not describe all possible interactions. Give your health care provider a list of all the medicines, herbs, non-prescription drugs, or dietary supplements you use. Also tell them if you smoke, drink alcohol, or use illegal drugs. Some items may interact with your medicine. What should I watch for while using this medicine? Visit your healthcare professional for regular checks on your progress. Tell your healthcare professional if your symptoms do not start to get better or if they get worse. You may get drowsy or dizzy. Do not drive, use machinery, or do anything that needs mental alertness until you know how this medicine affects you. Do not stand up or sit up quickly, especially if you are an older patient. This reduces the risk of dizzy or fainting spells. Alcohol may  interfere with the effect of this medicine. Tell your healthcare professional right away if you have any change in your eyesight. If you take migraine medicines for 10 or more days a month, your migraines may get worse. Keep a diary of headache days and medicine use. Contact your healthcare professional if your migraine attacks occur more frequently. What side effects may I notice from receiving this medicine? Side effects that you should report to your doctor or health care professional as soon as possible:  allergic reactions like skin rash, itching or hives, swelling of the face, lips, or tongue  changes in vision  chest pain or chest tightness  signs and symptoms of a dangerous change in heartbeat or heart rhythm like chest pain; dizziness; fast, irregular heartbeat; palpitations; feeling faint or lightheaded; falls; breathing problems  signs and symptoms of a stroke like changes in vision; confusion; trouble speaking or understanding; severe headaches; sudden numbness or weakness of the face, arm or leg; trouble walking; dizziness; loss of balance or coordination  signs and symptoms of serotonin syndrome like irritable; confusion; diarrhea; fast or irregular heartbeat; muscle twitching; stiff muscles; trouble walking; sweating; high fever; seizures; chills; vomiting Side effects that usually do not require medical attention (report to your doctor or health care professional if they continue or are bothersome):  diarrhea  dizziness  drowsiness  headache  nausea, vomiting  pain, tingling, numbness in the hands or feet  stomach pain This list may not describe all possible side effects. Call your doctor for medical advice about side effects. You may report side effects to FDA at 1-800-FDA-1088. Where should I keep my medicine? Keep out of the reach of children. Store at room temperature between 20 and 25 degrees C (68 and 77 degrees F). Throw away any unused medicine after the  expiration date. NOTE: This sheet is a summary. It may not cover all possible information. If you have questions about this medicine, talk to your doctor, pharmacist, or health care provider.  2020 Elsevier/Gold Standard (2017-11-01 14:55:22)

## 2020-03-06 ENCOUNTER — Ambulatory Visit (INDEPENDENT_AMBULATORY_CARE_PROVIDER_SITE_OTHER): Payer: No Typology Code available for payment source

## 2020-03-06 DIAGNOSIS — Z23 Encounter for immunization: Secondary | ICD-10-CM | POA: Diagnosis not present

## 2020-04-15 ENCOUNTER — Ambulatory Visit (INDEPENDENT_AMBULATORY_CARE_PROVIDER_SITE_OTHER): Payer: No Typology Code available for payment source | Admitting: Licensed Clinical Social Worker

## 2020-04-15 DIAGNOSIS — F418 Other specified anxiety disorders: Secondary | ICD-10-CM

## 2020-04-15 NOTE — Progress Notes (Signed)
Virtual Visit via Video Note  I connected with Alicia Benton on 04/15/20 at  4:00 PM EST by a video enabled telemedicine application and verified that I am speaking with the correct person using two identifiers.  Location: Patient: Home Provider: Office   I discussed the limitations of evaluation and management by telemedicine and the availability of in person appointments. The patient expressed understanding and agreed to proceed.    Comprehensive Clinical Assessment (CCA) Note  04/15/2020 Alicia Benton [id number]  Chief Complaint:  Chief Complaint  Patient presents with  . Stress   Visit Diagnosis: Other specified anxiety disorders    CCA Biopsychosocial Intake/Chief Complaint:  Stress  Current Symptoms/Problems: Worry, procrastination, distracts self when things feel overwhelming, avoids conflict, difficulty with concentration,   Patient Reported Schizophrenia/Schizoaffective Diagnosis in Past: No   Strengths: social, optimistic, very direct, likes to know what is going on, intelligent  Preferences: prefers to be around others, doesn't prefer to be alone, prefers to have things to do, prefers to do intellectual things, prefers being with her dog, doesn't prefer to be around negative people  Abilities: good Visual merchandiser, good singer, good at reading,   Type of Services Patient Feels are Needed: Therapy   Initial Clinical Notes/Concerns: Symptoms started around age 24 when she started middle school but increased 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 medical school, symptoms occur around 5 days a week, symptoms are moderate per patient   Mental Health Symptoms Depression:  None   Duration of Depressive symptoms: No data recorded  Mania:  None   Anxiety:   Difficulty concentrating; Worrying   Psychosis:  None   Duration of Psychotic symptoms: No data recorded  Trauma:  None   Obsessions:  None   Compulsions:  None   Inattention:  None   Hyperactivity/Impulsivity:  N/A    Oppositional/Defiant Behaviors:  None   Emotional Irregularity:  None   Other Mood/Personality Symptoms:  N/A    Mental Status Exam Appearance and self-care  Stature:  No data recorded  Weight:  Average weight   Clothing:  Casual   Grooming:  Normal   Cosmetic use:  Age appropriate   Posture/gait:  Normal   Motor activity:  Not Remarkable   Sensorium  Attention:  Normal   Concentration:  Normal   Orientation:  X5   Recall/memory:  Normal   Affect and Mood  Affect:  Appropriate   Mood:  Euthymic   Relating  Eye contact:  Normal   Facial expression:  Responsive   Attitude toward examiner:  Cooperative   Thought and Language  Speech flow: Normal   Thought content:  Appropriate to Mood and Circumstances   Preoccupation:  None   Hallucinations:  None   Organization:  No data recorded  [employer] of Knowledge:  Good   Intelligence:  Average   Abstraction:  Normal   Judgement:  Good   Reality Testing:  Realistic   Insight:  Good   Decision Making:  Normal   Social Functioning  Social Maturity:  Responsible   Social Judgement:  Normal   Stress  Stressors:  Transitions; School   Coping Ability:  Normal   Skill Deficits:  None   Supports:  Family     Religion: Religion/Spirituality Are You A Religious Person?: No How Might This Affect Treatment?: No impact  Leisure/Recreation: Leisure / Recreation Do You Have Hobbies?: Yes Leisure and Hobbies: Listen to podcast, history podcast, choir  Exercise/Diet: Exercise/Diet Do You Exercise?: Yes What Type of Exercise  Do You Do?: Run/Walk,Swimming How Many Times a Week Do You Exercise?: 1-3 times a week Have You Gained or Lost A Significant Amount of Weight in the Past Six Months?: No Do You Follow a Special Diet?: No Do You Have Any Trouble Sleeping?: No   CCA Employment/Education Employment/Work Situation: Employment / Work Situation Employment situation:  Employed Where is patient currently employed?: [employer] long has patient been employed?: 1.5 years Patient's job has been impacted by current illness: No What is the longest time patient has a held a job?: 1.5 Where was the patient employed at that time?: [place] Has patient ever been in the [employer]?: No  Education: Education Is Patient Currently Attending School?: No Last Grade Completed: 12 Name of High School: [employer] Did Garment/textile technologist From [employer]?: Yes Did Theme park manager?: Yes What Type of College Degree Do you Have?: Bachelors Did Designer, television/film set?:  (applying to medical school) What Was Your Major?: Sociology Did You Have Any Special Interests In School?: Science Did You Have An Individualized Education Program (IIEP): No Did You Have Any Difficulty At School?: No Patient's Education Has Been Impacted by Current Illness: No   CCA Family/Childhood History Family and Relationship History: Family history Marital status: Single Are you sexually active?: Yes What is your sexual orientation?: Bisexual Has your sexual activity been affected by drugs, alcohol, medication, or emotional stress?: N/A Does patient have children?: No  Childhood History:  Childhood History By whom was/is the patient raised?: Both parents Additional childhood history information: Both parents were in the home. Patient describes childhood as "great, typical." Description of patient's relationship with caregiver when they were a child: Mother: good,  Father: Good Patient's description of current relationship with people who raised him/her: Mother: good, Father: good How were you disciplined when you got in trouble as a child/adolescent?: things taken away Does patient have siblings?: Yes Number of Siblings: 1 Description of patient's current relationship with siblings: Younger Brother, Good relationship Did patient suffer any verbal/emotional/physical/sexual  abuse as a child?: No Did patient suffer from severe childhood neglect?: No Has patient ever been sexually abused/assaulted/raped as an adolescent or adult?: No Was the patient ever a victim of a crime or a disaster?: No Witnessed domestic violence?: No Has patient been affected by domestic violence as an adult?: No  Child/Adolescent Assessment:     CCA Substance Use Alcohol/Drug Use: Alcohol / Drug Use Pain Medications: See patient MAR Prescriptions: See patient MAR Over the Counter: See patient MAR History of alcohol / drug use?: No history of alcohol / drug abuse                         ASAM's:  Six Dimensions of Multidimensional Assessment  Dimension 1:  Acute Intoxication and/or Withdrawal Potential:   Dimension 1:  Description of individual's past and current experiences of substance use and withdrawal: None  Dimension 2:  Biomedical Conditions and Complications:   Dimension 2:  Description of patient's biomedical conditions and  complications: None  Dimension 3:  Emotional, Behavioral, or Cognitive Conditions and Complications:  Dimension 3:  Description of emotional, behavioral, or cognitive conditions and complications: NOne  Dimension 4:  Readiness to Change:  Dimension 4:  Description of Readiness to Change criteria: None  Dimension 5:  Relapse, Continued use, or Continued Problem Potential:  Dimension 5:  Relapse, continued use, or continued problem potential critiera description: None  Dimension 6:  Recovery/Living Environment:  Dimension  6:  Recovery/Iiving environment criteria description: None  ASAM Severity Score: ASAM's Severity Rating Score: 0  ASAM Recommended Level of Treatment:     Substance use Disorder (SUD)    Recommendations for Services/Supports/Treatments: Recommendations for Services/Supports/Treatments Recommendations For Services/Supports/Treatments: Individual Therapy  DSM5 Diagnoses: Patient Active Problem List   Diagnosis Date  Noted  . Chronic migraine without aura without status migrainosus, not intractable 07/26/2019  . Migraine with aura and without status migrainosus, not intractable 07/26/2019  . Seasonal allergies 07/20/2019  . At risk for sleep apnea 07/20/2019  . Migraine without aura and without status migrainosus, not intractable 02/12/2019  . Encounter for health supervision 12/03/2018  . Rectal bleeding 12/03/2018    Patient Centered Plan: Patient is on the following Treatment Plan(s):  Anxiety   Referrals to Alternative Service(s): Referred to Alternative Service(s):   Place:   Date:   Time:    Referred to Alternative Service(s):   Place:   Date:   Time:    Referred to Alternative Service(s):   Place:   Date:   Time:    Referred to Alternative Service(s):   Place:   Date:   Time:     I discussed the assessment and treatment plan with the patient. The patient was provided an opportunity to ask questions and all were answered. The patient agreed with the plan and demonstrated an understanding of the instructions.   The patient was advised to call back or seek an in-person evaluation if the symptoms worsen or if the condition fails to improve as anticipated.  I provided 60 minutes of non-face-to-face time during this encounter.  Bynum Bellows, LCSW

## 2020-05-19 ENCOUNTER — Ambulatory Visit: Payer: No Typology Code available for payment source | Admitting: Family Medicine

## 2020-05-29 NOTE — Progress Notes (Deleted)
No chief complaint on file.    HISTORY OF PRESENT ILLNESS: 05/29/20  Alicia Benton is a 25 y.o. female here today for follow up for migraines. She was started on Ajovy and continued nortriptyline 20mg  at last visit with Dr Lucia Gaskins in 01/2020. Abortive med changed from rizatriptan to naratriptan.   HISTORY (copied from Dr Trevor Mace previous note)  Interval history 01/10/2020: The diagnostic PSG on 09/13/2019 did not show any significant OSA. She was previously managed by [facility] for her headaches and this is the second time I m seeing her, she had a sleep evaluation in our office in May. Reviewed sleep evaluation data and agree with findings, moderate snoring mostly on her back. On nortriptyline and maxalt and zofran. With the Nortriptyline she has 8 migraine days a month, she also has other headaches. maxalt doesn't work that well. A lot of times it worse well then it does not work. Discussed MRi of the brain, at this time will hold off. Discussed starting Ajovy and injected her today, also changed her acute medication.Continue Nortriptyline.Do not get pregnant on Ajovy, discussed.  HPI:  Alicia Benton is a 25 y.o. female here as requested by Dr. Leveda Anna for migraines. I reviewed patient's chart, it appears she may have obstructive sleep apnea, over the last year she has been waking up with headaches, her primary care doctor has referred her for sleep studies.  But she also has a long history of migraine since the age of 33.  She has been managed at [facility] for her migraines and recently moved to [place].  I reviewed her neurology notes, patient is on Maxalt and Zofran for acute management, she has a past medical history of asthma, latent tuberculosis and migraines, Maxalt does resolve her headaches, Phenergan does not appear to help with the headaches but does help with the nausea but makes her sleepy.  She will occasionally have a week where she has approximately 3 migraines clustered together but no changes in  headache character, first seen in [facility] in 2019 for a 10-year history of migraines which at that time was gradually worsening in frequency, she has had numbness in her face with migraines and numbness in her arm with migraines just wants, severe nausea and vomiting as well as fatigue.  Right temporal and parietal, neck and shoulder, dull, she has an aura of Sunburst which resolved at age 22 but she did have 2 episodes of unilateral numbness once in the face and once in the arm, 5-6 headache days a month, 1-2 more severe migraine symptoms per month, the last 24 to 48 hours, associated nausea vomiting photophobia phonophobia, triggers include lack of sleep, certain foods, rest helps, severity 2-3 over 10 on average 6/10 at worst, no history of head trauma, she has a history of migraines in mother, maternal aunt, and grandmother.  She is not on any preventative medications.  She is also tried naproxen in the past.  She had an MRI of the brain in 2010(reviewed report), no focal intraparenchymal signal abnormality or mass lesion.she has migraines that start late in the evening and sometimes she wakes up with a headache. 15/30 days of headaches, she has 8/30 migraine days. Her migraines are on the right side, can be pulsating/pounding and throbbing, lots of nausea, she used to vomit but zofran helps, they can last 24-72 hours, they are moderately severe to severe, sleep helps, dark room helps, movement makes it worse. Stable, no new symptoms, no vision changes, not exertional or positional.  Extensive family history of migraines on mom's side.    Started at the age of 55, she has no auras but none since then. She has a non-hormonal IUD. At first her migraines were ibuprofen responsive, she went to [facility] and they tried maxalt and melatonin. Her migraines have progressed, she has migraines that start late in the evening and sometimes she wakes up with a headache. 15/30 days of headaches, she has 8/30 migraine days. Her  migraines are on the right side, can be pulsating/pounding and throbbing, lots of nausea, she used to vomit but zofran helps, they can last 24-72 hours, they are moderately severe to severe, sleep helps, dark room helps, movement makes it worse. Stable, no new symptoms, no vision changes, not exertional or positional. Extensive family history of migraines on mom's side.   Reviewed notes, labs and imaging from outside physicians, which showed:  CBC normal 11/2018    REVIEW OF SYSTEMS: Out of a complete 14 system review of symptoms, the patient complains only of the following symptoms, and all other reviewed systems are negative.    ALLERGIES: Allergies  Allergen Reactions  . Peanut-Containing Drug Products Hives, Itching and Rash  . Other Hives    peanuts     HOME MEDICATIONS: Outpatient Medications Prior to Visit  Medication Sig Dispense Refill  . Fremanezumab-vfrm (AJOVY) 225 MG/1.5ML SOAJ Inject 225 mg into the skin every 30 (thirty) days. 4.5 mL 11  . levocetirizine (XYZAL) 5 MG tablet Take 1 tablet (5 mg total) by mouth every evening. 90 tablet 1  . naratriptan (AMERGE) 2.5 MG tablet Take 1 tablet (2.5 mg total) by mouth as needed for migraine. Take one (1) tablet at onset of headache; if returns or does not resolve, may repeat after 2 hours; do not exceed five (5) mg in 24 hours. 9 tablet 11  . nortriptyline (PAMELOR) 10 MG capsule Take 2 capsules (20 mg total) by mouth at bedtime. 60 capsule 3  . ondansetron (ZOFRAN) 4 MG tablet Take 1 tablet (4 mg total) by mouth every 8 (eight) hours as needed for nausea or vomiting. 20 tablet 11  . rizatriptan (MAXALT) 10 MG tablet Take 1 tablet (10 mg total) by mouth as needed for migraine. May repeat in 2 hours if needed. Max 2 in 24 hours. 10 tablet 11   Facility-Administered Medications Prior to Visit  Medication Dose Route Frequency Provider Last Rate Last Admin  . Fremanezumab-vfrm SOSY 225 mg  225 mg Subcutaneous Once Anson Fret, MD         PAST MEDICAL HISTORY: Past Medical History:  Diagnosis Date  . Migraines      PAST SURGICAL HISTORY: Past Surgical History:  Procedure Laterality Date  . NO PAST SURGERIES       FAMILY HISTORY: Family History  Problem Relation Age of Onset  . Migraines Mother   . Thyroid disease Mother        graves  . Hypertension Mother   . Hypertension Father   . Migraines Maternal Grandmother   . Migraines Maternal Aunt   . Migraines Other      SOCIAL HISTORY: Social History   Socioeconomic History  . Marital status: Single    Spouse name: Not on file  . Number of children: Not on file  . Years of education: Not on file  . Highest education level: Bachelor's degree (e.g., BA, AB, BS)  Occupational History  . Not on file  Tobacco Use  . Smoking status: Never  Smoker  . Smokeless tobacco: Never Used  Vaping Use  . Vaping Use: Never used  Substance and Sexual Activity  . Alcohol use: Yes    Comment: rarely   . Drug use: Never  . Sexual activity: Not on file    Comment: paraguard IUD  Other Topics Concern  . Not on file  Social History Narrative   Lives with a roommate    Left handed   Caffeine: 1.5 cups/day    Social Determinants of Health   Financial Resource Strain: Not on file  Food Insecurity: Not on file  Transportation Needs: Not on file  Physical Activity: Not on file  Stress: Not on file  Social Connections: Not on file  Intimate Partner Violence: Not on file      PHYSICAL EXAM  There were no vitals filed for this visit. There is no height or weight on file to calculate BMI.   Generalized: Well developed, in no acute distress  Cardiology: normal rate and rhythm, no murmur auscultated  Respiratory: clear to auscultation bilaterally    Neurological examination  Mentation: Alert oriented to time, place, history taking. Follows all commands speech and language fluent Cranial nerve II-XII: Pupils were equal round reactive to  light. Extraocular movements were full, visual field were full on confrontational test. Facial sensation and strength were normal. Uvula tongue midline. Head turning and shoulder shrug  were normal and symmetric. Motor: The motor testing reveals 5 over 5 strength of all 4 extremities. Good symmetric motor tone is noted throughout.  Sensory: Sensory testing is intact to soft touch on all 4 extremities. No evidence of extinction is noted.  Coordination: Cerebellar testing reveals good finger-nose-finger and heel-to-shin bilaterally.  Gait and station: Gait is normal. Tandem gait is normal. Romberg is negative. No drift is seen.  Reflexes: Deep tendon reflexes are symmetric and normal bilaterally.     DIAGNOSTIC DATA (LABS, IMAGING, TESTING) - I reviewed patient records, labs, notes, testing and imaging myself where available.  Lab Results  Component Value Date   WBC 8.2 11/30/2018   HGB 12.4 11/30/2018   HCT 37.5 11/30/2018   MCV 88 11/30/2018   PLT 272 11/30/2018      Component Value Date/Time   NA 138 07/26/2019 0807   K 4.2 07/26/2019 0807   CL 104 07/26/2019 0807   CO2 23 07/26/2019 0807   GLUCOSE 80 07/26/2019 0807   BUN 11 07/26/2019 0807   CREATININE 0.67 07/26/2019 0807   CALCIUM 9.0 07/26/2019 0807   PROT 6.7 07/26/2019 0807   ALBUMIN 4.4 07/26/2019 0807   AST 15 07/26/2019 0807   ALT 8 07/26/2019 0807   ALKPHOS 43 07/26/2019 0807   BILITOT 0.3 07/26/2019 0807   GFRNONAA 125 07/26/2019 0807   GFRAA 144 07/26/2019 0807   No results found for: CHOL, HDL, LDLCALC, LDLDIRECT, TRIG, CHOLHDL No results found for: GYIR4W No results found for: VITAMINB12 Lab Results  Component Value Date   TSH 2.320 07/26/2019    No flowsheet data found.   No flowsheet data found.   ASSESSMENT AND PLAN  25 y.o. year old female  has a past medical history of Migraines. here with   No diagnosis found.    No orders of the defined types were placed in this encounter.    No  orders of the defined types were placed in this encounter.     I spent 20 minutes of face-to-face and non-face-to-face time with patient.  This included previsit chart review,  lab review, study review, order entry, electronic health record documentation, patient education.    Shawnie Dapper, MSN, FNP-C 05/29/2020, 9:05 AM  [facility] [address], [address] [place], [place] [address] [phone]

## 2020-05-29 NOTE — Patient Instructions (Incomplete)
Below is our plan:  We will ***  Please make sure you are staying well hydrated. I recommend 50-60 ounces daily. Well balanced diet and regular exercise encouraged.    Please continue follow up with care team as directed.   Follow up with *** in ***  You may receive a survey regarding today's visit. I encourage you to leave honest feed back as I do use this information to improve patient care. Thank you for seeing me today!      Migraine Headache A migraine headache is a very strong throbbing pain on one side or both sides of your head. This type of headache can also cause other symptoms. It can last from 4 hours to 3 days. Talk with your doctor about what things may bring on (trigger) this condition. What are the causes? The exact cause of this condition is not known. This condition may be triggered or caused by:  Drinking alcohol.  Smoking.  Taking medicines, such as: ? Medicine used to treat chest pain (nitroglycerin). ? Birth control pills. ? Estrogen. ? Some blood pressure medicines.  Eating or drinking certain products.  Doing physical activity. Other things that may trigger a migraine headache include:  Having a menstrual period.  Pregnancy.  Hunger.  Stress.  Not getting enough sleep or getting too much sleep.  Weather changes.  Tiredness (fatigue). What increases the risk?  Being 25-55 years old.  Being female.  Having a family history of migraine headaches.  Being Caucasian.  Having depression or anxiety.  Being very overweight. What are the signs or symptoms?  A throbbing pain. This pain may: ? Happen in any area of the head, such as on one side or both sides. ? Make it hard to do daily activities. ? Get worse with physical activity. ? Get worse around bright lights or loud noises.  Other symptoms may include: ? Feeling sick to your stomach (nauseous). ? Vomiting. ? Dizziness. ? Being sensitive to bright lights, loud noises, or  smells.  Before you get a migraine headache, you may get warning signs (an aura). An aura may include: ? Seeing flashing lights or having blind spots. ? Seeing bright spots, halos, or zigzag lines. ? Having tunnel vision or blurred vision. ? Having numbness or a tingling feeling. ? Having trouble talking. ? Having weak muscles.  Some people have symptoms after a migraine headache (postdromal phase), such as: ? Tiredness. ? Trouble thinking (concentrating). How is this treated?  Taking medicines that: ? Relieve pain. ? Relieve the feeling of being sick to your stomach. ? Prevent migraine headaches.  Treatment may also include: ? Having acupuncture. ? Avoiding foods that bring on migraine headaches. ? Learning ways to control your body functions (biofeedback). ? Therapy to help you know and deal with negative thoughts (cognitive behavioral therapy). Follow these instructions at home: Medicines  Take over-the-counter and prescription medicines only as told by your doctor.  Ask your doctor if the medicine prescribed to you: ? Requires you to avoid driving or using heavy machinery. ? Can cause trouble pooping (constipation). You may need to take these steps to prevent or treat trouble pooping:  Drink enough fluid to keep your pee (urine) pale yellow.  Take over-the-counter or prescription medicines.  Eat foods that are high in fiber. These include beans, whole grains, and fresh fruits and vegetables.  Limit foods that are high in fat and sugar. These include fried or sweet foods. Lifestyle  Do not drink alcohol.  Do   not use any products that contain nicotine or tobacco, such as cigarettes, e-cigarettes, and chewing tobacco. If you need help quitting, ask your doctor.  Get at least 8 hours of sleep every night.  Limit and deal with stress. General instructions  Keep a journal to find out what may bring on your migraine headaches. For example, write down: ? What you eat  and drink. ? How much sleep you get. ? Any change in what you eat or drink. ? Any change in your medicines.  If you have a migraine headache: ? Avoid things that make your symptoms worse, such as bright lights. ? It may help to lie down in a dark, quiet room. ? Do not drive or use heavy machinery. ? Ask your doctor what activities are safe for you.  Keep all follow-up visits as told by your doctor. This is important.      Contact a doctor if:  You get a migraine headache that is different or worse than others you have had.  You have more than 15 headache days in one month. Get help right away if:  Your migraine headache gets very bad.  Your migraine headache lasts longer than 72 hours.  You have a fever.  You have a stiff neck.  You have trouble seeing.  Your muscles feel weak or like you cannot control them.  You start to lose your balance a lot.  You start to have trouble walking.  You pass out (faint).  You have a seizure. Summary  A migraine headache is a very strong throbbing pain on one side or both sides of your head. These headaches can also cause other symptoms.  This condition may be treated with medicines and changes to your lifestyle.  Keep a journal to find out what may bring on your migraine headaches.  Contact a doctor if you get a migraine headache that is different or worse than others you have had.  Contact your doctor if you have more than 15 headache days in a month. This information is not intended to replace advice given to you by your health care provider. Make sure you discuss any questions you have with your health care provider. Document Revised: 08/11/2018 Document Reviewed: 06/01/2018 Elsevier Patient Education  2021 Elsevier Inc.  

## 2020-06-02 ENCOUNTER — Ambulatory Visit: Payer: No Typology Code available for payment source | Admitting: Family Medicine

## 2020-06-02 ENCOUNTER — Encounter: Payer: Self-pay | Admitting: Family Medicine

## 2020-06-02 DIAGNOSIS — Z0289 Encounter for other administrative examinations: Secondary | ICD-10-CM

## 2020-06-02 DIAGNOSIS — G43709 Chronic migraine without aura, not intractable, without status migrainosus: Secondary | ICD-10-CM

## 2020-06-03 ENCOUNTER — Ambulatory Visit (INDEPENDENT_AMBULATORY_CARE_PROVIDER_SITE_OTHER): Payer: No Typology Code available for payment source | Admitting: Licensed Clinical Social Worker

## 2020-06-03 DIAGNOSIS — F418 Other specified anxiety disorders: Secondary | ICD-10-CM

## 2020-06-04 NOTE — Progress Notes (Signed)
   THERAPIST PROGRESS NOTE  Session Time: 5:00 pm-5:45 pm  Type of Therapy: Individual Therapy  Treatment Goals addressed:   Interventions: Therapist utilized CBT and Solution Focused brief therapy to address anxiety. Therapist provided support and empathy to patient as she shared her thoughts and feelings. Therapist had patient identify pre-session changes. Therapist discussed with patient her relationship and her stressors about ending the relationship. Therapist taught patient to filter her tasks/opportunities through the filter of importance level, amount of passion/motivation for it, and level of urgency to complete task or opportunity to reduce anxiety and procrastination.   Effectiveness: Patient was oriented x5 (person, place, situation, time, and object). Patient was casually dressed, and appropriately groomed. Patient was pleasant and engaged in session. 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 She is wanting to end the relationship she is in. Patient is happy in the relationship but doesn't see a future with him. She is going to have the conversation with him but not at this time. His mother is staying with them due to not being able to return to her home country due to COVID. Patient understood filtering her decisions through importance, passion/motivation, and urgency to help with procrastination.   Patient engaged in session. Patient responded well to interventions. 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 Patient will continue in outpatient therapy due to being the least restrictive service to meet her needs. Patient made no progress on his goals.   Suicidal/Homicidal: Negativewithout intent/plan  Plan: Return again in 1 weeks. Patient will examine her decisions through the lens of importance, passion, and urgency to determine what to focus on first and  complete 5 out of 7 days.   Diagnosis: Axis I: Other specified anxiety disorders    Axis II: No diagnosis    Bynum Bellows, LCSW 06/04/2020

## 2020-06-17 ENCOUNTER — Ambulatory Visit (INDEPENDENT_AMBULATORY_CARE_PROVIDER_SITE_OTHER): Payer: No Typology Code available for payment source | Admitting: Licensed Clinical Social Worker

## 2020-06-17 DIAGNOSIS — F418 Other specified anxiety disorders: Secondary | ICD-10-CM

## 2020-06-18 NOTE — Progress Notes (Signed)
   THERAPIST PROGRESS NOTE  Session Time: 5:00 pm-5:45 pm  Type of Therapy: Individual Therapy   Session#2  Treatment Goals addressed: Alicia Benton will manage stress as evidenced by managing stress, increasing coping skills, and reducing procrastination for 5 out of 7 days for 60 days.   Interventions: Therapist utilized CBT and Solution focused breif therapy to address anxiety. Therapist followed up on patient's homework. Therapist explored patient's reasons for procrastination. Therapist had patient identify what has worked for her in the past with staying motivated/not procrastinating.   Effectiveness: Patient was oriented x5 (person, place, situation, time, and object). Patient was dressed casually, and groomed, appropriately. Patient was alert, engaged, and pleasant during session. She continues to have stress related to work. The other aspects of her life are stable including her relationship. She is continuing to work on her motivation, and finding balance in her life. 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 Patient did note that she has been successful before with staying motivated/not procrastinating by scheduling her life out. She feels like this will be helpful for her as well as getting an idea of what to expect from medical school. She is going to work on not delaying a task, and even setting a timer for 10-20 minutes while she does task that she would normally avoid. If she completes the set time she has made progress, if she continues beyond the set time she may complete her task/delay gratification.    Patient engaged in session. Patient responded well to interventions. Patient continues to meet criteria for Other specified anxiety  disorders. Patient will continue in outpatient therapy due to being the least restrictive service to meet her needs. Patient made minimal progress on his goals.   Suicidal/Homicidal: Negativewithout intent/plan  Plan: Return again in 1-2 weeks.   Diagnosis: Axis I: Other specified anxiety disorders    Axis II: No diagnosis    Bynum Bellows, LCSW 06/18/2020

## 2020-07-01 ENCOUNTER — Other Ambulatory Visit: Payer: Self-pay | Admitting: Neurology

## 2020-07-01 ENCOUNTER — Ambulatory Visit (HOSPITAL_COMMUNITY): Payer: Self-pay | Admitting: Licensed Clinical Social Worker

## 2020-07-01 ENCOUNTER — Telehealth: Payer: Self-pay | Admitting: Neurology

## 2020-07-01 MED ORDER — NORTRIPTYLINE HCL 10 MG PO CAPS: 20.0000 mg | ORAL_CAPSULE | Freq: Every day | ORAL | 2 refills | Status: DC

## 2020-07-01 NOTE — Telephone Encounter (Signed)
Pt request refill nortriptyline (PAMELOR) 10 MG capsule and update pharmacy: [facility] [phone]

## 2020-07-01 NOTE — Telephone Encounter (Signed)
Pending appt with Amy NP in May 2022. I refilled pt's Nortriptyline to get her through appt.

## 2020-07-02 ENCOUNTER — Other Ambulatory Visit: Payer: Self-pay | Admitting: Family Medicine

## 2020-07-15 ENCOUNTER — Ambulatory Visit (INDEPENDENT_AMBULATORY_CARE_PROVIDER_SITE_OTHER): Payer: No Typology Code available for payment source | Admitting: Licensed Clinical Social Worker

## 2020-07-15 DIAGNOSIS — F418 Other specified anxiety disorders: Secondary | ICD-10-CM

## 2020-07-16 NOTE — Progress Notes (Signed)
Virtual Visit via Video Note  I connected with [name] on 07/16/20 at  5:00 PM EDT by a video enabled telemedicine application and verified that I am speaking with the correct person using two identifiers.  Location: Patient: Home Provider: Office   I discussed the limitations of evaluation and management by telemedicine and the availability of in person appointments. The patient expressed understanding and agreed to proceed.   THERAPIST PROGRESS NOTE  Session Time: 5:00 pm-5:40 pm  Type of Therapy: Individual Therapy   Session#4  Treatment Goals addressed: Margaretmary will manage stress as evidenced by managing stress, increasing coping skills, and reducing procrastination for 5 out of 7 days for 60 days.   Interventions: Therapist utilized CBT and Solution focused brief therapy to address anxiety. Therapist provided support and empathy to patient while she shared her thoughts and feelings during session. Therapist followed up on patients homework to reduce procrastination. Therapist used poly vagal theory to help patient map her nervous system responses to "calm" and anxiety triggers situations. Therapist assisted patient in identifying how to self regulate.    Effectiveness: Patient was oriented x5 (person, place, situation, time, and object). Patient was dressed casually, and appropriately groomed. Patient was alert, engaged, and pleasant during session. Patient noted that her mood and anxiety for the most part have gone well. She has been working on her procrastination and has made improvements. She sets herself a timer to get herself started. 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 Patient noted there are certain patients who tend to get upset frequently during visits and it makes her feel uncomfortable. Patient was able to identify the state of her ventral vagal. She describes it as a relaxing,  "low light," and she feels calm, at peace, and connected to others. Patient described her sympathetic nervous response as "red" where she gets tension in her shoulders, heart racing, and shallow breathing. Patient understood she can bring the "low light" into "red" situations specially when patients are getting triggered by deep breathing, and shoulder rolls. Patient agreed to work on her breathing daily, and checking in on her tension throughout the day to reduce it in her shoulders.   Patient engaged in session. Patient responded well to interventions. 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 Patient will continue in outpatient therapy due to being the least restrictive service to meet her needs. Patient made minimal progress on his goals.   Suicidal/Homicidal: Negativewithout intent/plan  Plan: Return again in 1-2 weeks.   Diagnosis: Axis I: Other specified anxiety disorders    Axis II: No diagnosis  I discussed the assessment and treatment plan with the patient. The patient was provided an opportunity to ask questions and all were answered. The patient agreed with the plan and demonstrated an understanding of the instructions.   The patient was advised to call back or seek an in-person evaluation if the symptoms worsen or if the condition fails to improve as anticipated.  I provided 40 minutes of non-face-to-face time during this encounter.  Bynum Bellows, LCSW 07/16/2020

## 2020-07-29 ENCOUNTER — Ambulatory Visit (HOSPITAL_COMMUNITY): Payer: No Typology Code available for payment source | Admitting: Licensed Clinical Social Worker

## 2020-08-12 ENCOUNTER — Ambulatory Visit (INDEPENDENT_AMBULATORY_CARE_PROVIDER_SITE_OTHER): Payer: No Typology Code available for payment source | Admitting: Licensed Clinical Social Worker

## 2020-08-12 DIAGNOSIS — F418 Other specified anxiety disorders: Secondary | ICD-10-CM

## 2020-08-13 NOTE — Progress Notes (Signed)
Virtual Visit via Video Note  I connected with [name] on 08/13/20 at  5:00 PM EDT by a video enabled telemedicine application and verified that I am speaking with the correct person using two identifiers.  Location: Patient: Home Provider: Office   I discussed the limitations of evaluation and management by telemedicine and the availability of in person appointments. The patient expressed understanding and agreed to proceed.   THERAPIST PROGRESS NOTE  Session Time: 5:00 pm-5:45 pm  Type of Therapy: Individual Therapy   Session#5  Treatment Goals addressed: Jadesola will manage stress as evidenced by managing stress, increasing coping skills, and reducing procrastination for 5 out of 7 days for 60 days.   Interventions: Therapist utilized CBT and Solution focused brief therapy to address mood and anxiety. Therapist provided support and empathy to patient while she shared her thoughts and feelings in session. Therapist had patient identify what has gone well/presession change. Therapist worked with patient to identify ways to self soothe at night when anxious thoughts arise.   Effectiveness: Patient was oriented x5 (person, place, situation, time, and object). Patient was dressed casually, and appropriately groomed. Patient was engaged, alert, and pleasant. Patient has been feeling less stressed during work. 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 Patient feels like she is regulating her mood and anxiety through mapping her nervous system responses. Patient noted that at night she has difficulty with her thoughts. In the past developing a "story" to distract her mind helped her fall asleep. After discussion, patient understood she can use "havening touch" to self soothe and manage her difficult/anxious thoughts at night. Havening touch is based in neuroscience and helps regulate the body and mind.   Patient engaged in session.  Patient responded well to interventions. 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 Patient will continue in outpatient therapy due to being the least restrictive service to meet her needs. Patient made moderate progress on his goals.   Suicidal/Homicidal: Negativewithout intent/plan  Plan: Return again in 1-2 weeks.   Diagnosis: Axis I: Other specified anxiety disorders    Axis II: No diagnosis  I discussed the assessment and treatment plan with the patient. The patient was provided an opportunity to ask questions and all were answered. The patient agreed with the plan and demonstrated an understanding of the instructions.   The patient was advised to call back or seek an in-person evaluation if the symptoms worsen or if the condition fails to improve as anticipated.  I provided 45 minutes of non-face-to-face time during this encounter.  Bynum Bellows, LCSW 08/13/2020

## 2020-08-26 ENCOUNTER — Ambulatory Visit (INDEPENDENT_AMBULATORY_CARE_PROVIDER_SITE_OTHER): Payer: No Typology Code available for payment source | Admitting: Licensed Clinical Social Worker

## 2020-08-26 DIAGNOSIS — F418 Other specified anxiety disorders: Secondary | ICD-10-CM | POA: Diagnosis not present

## 2020-08-27 ENCOUNTER — Other Ambulatory Visit: Payer: Self-pay | Admitting: Neurology

## 2020-08-27 DIAGNOSIS — G43709 Chronic migraine without aura, not intractable, without status migrainosus: Secondary | ICD-10-CM

## 2020-08-27 MED ORDER — NORTRIPTYLINE HCL 10 MG PO CAPS: 40.0000 mg | ORAL_CAPSULE | Freq: Every day | ORAL | 3 refills | Status: DC

## 2020-08-28 NOTE — Progress Notes (Signed)
Virtual Visit via Video Note  I connected with Alicia Benton on 08/28/20 at  1:00 PM EDT by a video enabled telemedicine application and verified that I am speaking with the correct person using two identifiers.  Location: Patient: Home Provider: Office   I discussed the limitations of evaluation and management by telemedicine and the availability of in person appointments. The patient expressed understanding and agreed to proceed.   THERAPIST PROGRESS NOTE  Session Time: 1:00 pm-1:45 pm  Type of Therapy: Individual Therapy   Session#6  Treatment Goals addressed: Alicia Benton will manage stress as evidenced by managing stress, increasing coping skills, and reducing procrastination for 5 out of 7 days for 60 days.   Interventions: Therapist utilized CBT and Solution focused brief therapy to address anxiety. Therapist provided support and empathy to patient during session. Therapist assessed triggers for anxiety and mood and small steps to take to get back on track with her daily activities including cleaning up, clean eating, etc.   Effectiveness: Patient was oriented x5 (person, place, situation, time, and object). Patient was casually dressed, and appropriately groomed. Patient was engaged, alert, and pleasant. Patient noted that things at work have been stable. She feels like she has stopped doing the small things including watching her eating, cleaning up, and going to the gym. Patient doesn't feel overwhelmed right now but has major changes lurking. Patient is trying to take small steps to manage these steps. She is beating herself up for not doing these tasks and makes her feel down.    Patient engaged in session. Patient responded well to interventions. 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 Patient will continue in outpatient therapy due to being the least restrictive service to meet her needs. Patient made moderate progress on his goals.    Suicidal/Homicidal: Negativewithout intent/plan  Plan: Return again in 1-2 weeks.   Diagnosis: Axis I: Other specified anxiety disorders    Axis II: No diagnosis  I discussed the assessment and treatment plan with the patient. The patient was provided an opportunity to ask questions and all were answered. The patient agreed with the plan and demonstrated an understanding of the instructions.   The patient was advised to call back or seek an in-person evaluation if the symptoms worsen or if the condition fails to improve as anticipated.  I provided 45 minutes of non-face-to-face time during this encounter.  Bynum Bellows, LCSW 08/28/2020

## 2020-09-09 NOTE — Progress Notes (Addendum)
Chief Complaint  Patient presents with  . Follow-up    RM 1, alone, HA 2-3 x week, states they have been more consistent, more so when shes on her cycle.      HISTORY OF PRESENT ILLNESS: 09/10/20 ALL:  Alicia Benton is a 25 y.o. female here today for follow up for migraines. She continued Ajovy since last visit in 01/2020. She stopped nortriptyline for a few months but the was diagnosed with Covid in early 2022. She restarted nortriptyline and titrated dose. She just increased dose from 20mg  to 40mg  about 10 days ago. She feels that it is making her really sleepy. She does not feel it has helped with headaches. Rizatriptan was not effective and switched to naratriptan. She has taken it twice. She felt that it helped some but migraine returns the next day and she felt more dizzy. She likes rizatriptan better. Tylenol or ibuprofen usually eases pain. She usally takes OTCs 3 times a week unless during her menstrual cycle and she will take daily. 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 She feels that trying to figure out the logistics of moving to [place] and getting loans has added more stress but feels better, overall, then in previous semesters. She uses [employer].    HISTORY (copied from Dr February previous note)  Interval history 01/10/2020: The diagnostic PSG on 09/13/2019 did not show any significant OSA. She was previously managed by [facility] for her headaches and this is the second time I m seeing her, she had a sleep evaluation in our office in May. Reviewed sleep evaluation data and agree with findings, moderate snoring mostly on her back. On nortriptyline and maxalt and zofran. With the Nortriptyline she has 8 migraine days a month, she also has other headaches. maxalt doesn't work that well. A lot of times it worse well then it does not work. Discussed MRi of the brain, at this time will hold off. Discussed starting Ajovy and injected her today, also changed her acute medication.Continue  Nortriptyline.Do not get pregnant on Ajovy, discussed.  HPI:  Brookie Wayment is a 25 y.o. female here as requested by Dr. [name] for migraines. I reviewed patient's chart, it appears she may have obstructive sleep apnea, over the last year she has been waking up with headaches, her primary care doctor has referred her for sleep studies.  But she also has a long history of migraine since the age of 66.  She has been managed at [facility] for her migraines and recently moved to [place].  I reviewed her neurology notes, patient is on Maxalt and Zofran for acute management, she has a past medical history of asthma, latent tuberculosis and migraines, Maxalt does resolve her headaches, Phenergan does not appear to help with the headaches but does help with the nausea but makes her sleepy.  She will occasionally have a week where she has approximately 3 migraines clustered together but no changes in headache character, first seen in [facility] in 2019 for a 10-year history of migraines which at that time was gradually worsening in frequency, she has had numbness in her face with migraines and numbness in her arm with migraines just wants, severe nausea and vomiting as well as fatigue.  Right temporal and parietal, neck and shoulder, dull, she has an aura of Sunburst which resolved at age 77 but she did have 2 episodes of unilateral numbness once in the face and once in the arm, 5-6 headache days a month, 1-2 more severe  migraine symptoms per month, the last 24 to 48 hours, associated nausea vomiting photophobia phonophobia, triggers include lack of sleep, certain foods, rest helps, severity 2-3 over 10 on average 6/10 at worst, no history of head trauma, she has a history of migraines in mother, maternal aunt, and grandmother.  She is not on any preventative medications.  She is also tried naproxen in the past.  She had an MRI of the brain in 2010(reviewed report), no focal intraparenchymal signal abnormality or mass lesion.she has  migraines that start late in the evening and sometimes she wakes up with a headache. 15/30 days of headaches, she has 8/30 migraine days. Her migraines are on the right side, can be pulsating/pounding and throbbing, lots of nausea, she used to vomit but zofran helps, they can last 24-72 hours, they are moderately severe to severe, sleep helps, dark room helps, movement makes it worse. Stable, no new symptoms, no vision changes, not exertional or positional. Extensive family history of migraines on mom's side.    Started at the age of 60, she has no auras but none since then. She has a non-hormonal IUD. At first her migraines were ibuprofen responsive, she went to [facility] and they tried maxalt and melatonin. Her migraines have progressed, she has migraines that start late in the evening and sometimes she wakes up with a headache. 15/30 days of headaches, she has 8/30 migraine days. Her migraines are on the right side, can be pulsating/pounding and throbbing, lots of nausea, she used to vomit but zofran helps, they can last 24-72 hours, they are moderately severe to severe, sleep helps, dark room helps, movement makes it worse. Stable, no new symptoms, no vision changes, not exertional or positional. Extensive family history of migraines on mom's side.   Reviewed notes, labs and imaging from outside physicians, which showed:  CBC normal 11/2018  See above     REVIEW OF SYSTEMS: Out of a complete 14 system review of symptoms, the patient complains only of the following symptoms, headaches and all other reviewed systems are negative.    ALLERGIES: Allergies  Allergen Reactions  . Peanut-Containing Drug Products Hives, Itching and Rash  . Other Hives    peanuts     HOME MEDICATIONS: Outpatient Medications Prior to Visit  Medication Sig Dispense Refill  . Fremanezumab-vfrm (AJOVY) 225 MG/1.5ML SOAJ Inject 225 mg into the skin every 30 (thirty) days. 4.5 mL 11  . levocetirizine (XYZAL) 5 MG  tablet TAKE 1 TABLET EVERY EVENING 30 tablet 5  . montelukast (SINGULAIR) 10 MG tablet Take 10 mg by mouth at bedtime.    . ondansetron (ZOFRAN) 4 MG tablet Take 1 tablet (4 mg total) by mouth every 8 (eight) hours as needed for nausea or vomiting. 20 tablet 11  . rizatriptan (MAXALT) 10 MG tablet Take 1 tablet (10 mg total) by mouth as needed for migraine. May repeat in 2 hours if needed. Max 2 in 24 hours. 10 tablet 11  . naratriptan (AMERGE) 2.5 MG tablet Take 1 tablet (2.5 mg total) by mouth as needed for migraine. Take one (1) tablet at onset of headache; if returns or does not resolve, may repeat after 2 hours; do not exceed five (5) mg in 24 hours. 9 tablet 11  . nortriptyline (PAMELOR) 10 MG capsule Take 4 capsules (40 mg total) by mouth at bedtime. 120 capsule 3  . Fremanezumab-vfrm SOSY 225 mg      No facility-administered medications prior to visit.  PAST MEDICAL HISTORY: Past Medical History:  Diagnosis Date  . Migraines      PAST SURGICAL HISTORY: Past Surgical History:  Procedure Laterality Date  . NO PAST SURGERIES       FAMILY HISTORY: Family History  Problem Relation Age of Onset  . Migraines Mother   . Thyroid disease Mother        graves  . Hypertension Mother   . Hypertension Father   . Migraines Maternal Grandmother   . Migraines Maternal Aunt   . Migraines Other      SOCIAL HISTORY: Social History   Socioeconomic History  . Marital status: Single    Spouse name: Not on file  . Number of children: Not on file  . Years of education: Not on file  . Highest education level: Bachelor's degree (e.g., BA, AB, BS)  Occupational History  . Not on file  Tobacco Use  . Smoking status: Never Smoker  . Smokeless tobacco: Never Used  Vaping Use  . Vaping Use: Never used  Substance and Sexual Activity  . Alcohol use: Yes    Comment: rarely   . Drug use: Never  . Sexual activity: Not on file    Comment: paraguard IUD  Other Topics Concern  . Not  on file  Social History Narrative   Lives with a roommate    Left handed   Caffeine: 1.5 cups/day    Social Determinants of Health   Financial Resource Strain: Not on file  Food Insecurity: Not on file  Transportation Needs: Not on file  Physical Activity: Not on file  Stress: Not on file  Social Connections: Not on file  Intimate Partner Violence: Not on file      PHYSICAL EXAM  Vitals:   09/10/20 1056  BP: 117/79  Pulse: 78  Weight: 161 lb (73 kg)  Height: 5' 7.5" (1.715 m)   Body mass index is 24.84 kg/m.   Generalized: Well developed, in no acute distress  Cardiology: normal rate and rhythm, no murmur auscultated  Respiratory: clear to auscultation bilaterally    Neurological examination  Mentation: Alert oriented to time, place, history taking. Follows all commands speech and language fluent Cranial nerve II-XII: Pupils were equal round reactive to light. Extraocular movements were full, visual field were full on confrontational test. Facial sensation and strength were normal. Head turning and shoulder shrug  were normal and symmetric. Motor: The motor testing reveals 5 over 5 strength of all 4 extremities. Good symmetric motor tone is noted throughout.  Gait and station: Gait is normal.     DIAGNOSTIC DATA (LABS, IMAGING, TESTING) - I reviewed patient records, labs, notes, testing and imaging myself where available.  Lab Results  Component Value Date   WBC 8.2 11/30/2018   HGB 12.4 11/30/2018   HCT 37.5 11/30/2018   MCV 88 11/30/2018   PLT 272 11/30/2018      Component Value Date/Time   NA 138 07/26/2019 0807   K 4.2 07/26/2019 0807   CL 104 07/26/2019 0807   CO2 23 07/26/2019 0807   GLUCOSE 80 07/26/2019 0807   BUN 11 07/26/2019 0807   CREATININE 0.67 07/26/2019 0807   CALCIUM 9.0 07/26/2019 0807   PROT 6.7 07/26/2019 0807   ALBUMIN 4.4 07/26/2019 0807   AST 15 07/26/2019 0807   ALT 8 07/26/2019 0807   ALKPHOS 43 07/26/2019 0807   BILITOT  0.3 07/26/2019 0807   GFRNONAA 125 07/26/2019 0807   GFRAA 144 07/26/2019 [id number]  No results found for: CHOL, HDL, LDLCALC, LDLDIRECT, TRIG, CHOLHDL No results found for: ZOXW9U No results found for: VITAMINB12 Lab Results  Component Value Date   TSH 2.320 07/26/2019    No flowsheet data found.   No flowsheet data found.   ASSESSMENT AND PLAN  25 y.o. year old female  has a past medical history of Migraines. here with    Chronic migraine without aura without status migrainosus, not intractable  Nattie reports that headache days have n=been more consistent over the past few months. She is having 8-13 headache days with 1-2 migraine days every month. Ajovy seems to be helping some. Nortriptyline is making her feel badly, more groggy, over the past few weeks. We will continue Ajovy every month. She will wean nortriptyline by 1 tablet every week until discontinued. We will add low dose topiramate. She will start 25mg  daily for 1-2 weeks then increase to 50mg  daily. Side effects reviewed. She was advised against pregnancy. She will continue rizatriptan for abortive therapy. May consider Aleve daily 5 days prior to menses for menstrual related migraines but avoid OTC more than 1-2 times weekly. She will continue healthy lifestyle habits. She will follow up in 3-4 months.    No orders of the defined types were placed in this encounter.    Meds ordered this encounter  Medications  . topiramate (TOPAMAX) 25 MG tablet    Sig: Take 2 tablets (50 mg total) by mouth daily. Start with 1 tablet at bedtime for 1-2 weeks then increase dose to 50mg  at bedtime    Dispense:  180 tablet    Refill:  3    Order Specific Question:   Supervising Provider    Answer:         , MSN, FNP-C 09/10/2020, 1:09 PM  [facility] [address], [address] [place], 11/10/2020 [address] [phone]  agree with assessment and plan as stated.     [place], MD [facility]

## 2020-09-09 NOTE — Patient Instructions (Signed)
Below is our plan:  We will continue Ajovy every month. We will discontinue nortriptyline. Decrease dose by 1 tablet every week then discontinue. We will start a low dose of topiramate. Take  at bedtime for 1-2 weeks then increase to  at bedtime.   Please make sure you are staying well hydrated. I recommend 50-60 ounces daily. Well balanced diet and regular exercise encouraged. Consistent sleep schedule with 6-8 hours recommended.   Please continue follow up with care team as directed.   Follow up with me in 3-4 months   You may receive a survey regarding today's visit. I encourage you to leave honest feed back as I do use this information to improve patient care. Thank you for seeing me today!    Topiramate tablets What is this medicine? TOPIRAMATE (toe PYRE a mate) is used to treat seizures in adults or children with epilepsy. It is also used for the prevention of migraine headaches. This medicine may be used for other purposes; ask your health care provider or pharmacist if you have questions. COMMON BRAND NAME(S): Topamax, Topiragen What should I tell my health care provider before I take this medicine? They need to know if you have any of these conditions:  bleeding disorder  kidney disease  lung disease  suicidal thoughts, plans, or attempt  an unusual or allergic reaction to topiramate, other medicines, foods, dyes, or preservatives  pregnant or trying to get pregnant  breast-feeding How should I use this medicine? Take this medicine by mouth with a glass of water. Follow the directions on the prescription label. Do not cut, crush or chew this medicine. Swallow the tablets whole. You can take it with or without food. If it upsets your stomach, take it with food. Take your medicine at regular intervals. Do not take it more often than directed. Do not stop taking except on your doctor's advice. A special MedGuide will be given to you by the pharmacist with each  prescription and refill. Be sure to read this information carefully each time. Talk to your pediatrician regarding the use of this medicine in children. While this drug may be prescribed for children as young as 37 years of age for selected conditions, precautions do apply. Overdosage: If you think you have taken too much of this medicine contact a poison control center or emergency room at once. NOTE: This medicine is only for you. Do not share this medicine with others. What if I miss a dose? If you miss a dose, take it as soon as you can. If your next dose is to be taken in less than 6 hours, then do not take the missed dose. Take the next dose at your regular time. Do not take double or extra doses. What may interact with this medicine? This medicine may interact with the following medications:  acetazolamide  alcohol  antihistamines for allergy, cough, and cold  aspirin and aspirin-like medicines  atropine  birth control pills  certain medicines for anxiety or sleep  certain medicines for bladder problems like oxybutynin, tolterodine  certain medicines for depression like amitriptyline, fluoxetine, sertraline  certain medicines for seizures like carbamazepine, phenobarbital, phenytoin, primidone, valproic acid, zonisamide  certain medicines for stomach problems like dicyclomine, hyoscyamine  certain medicines for travel sickness like scopolamine  certain medicines for Parkinson's disease like benztropine, trihexyphenidyl  certain medicines that treat or prevent blood clots like warfarin, enoxaparin, dalteparin, apixaban, dabigatran, and rivaroxaban  digoxin  general anesthetics like halothane, isoflurane, methoxyflurane, propofol  hydrochlorothiazide  ipratropium  lithium  medicines that relax muscles for surgery  metformin  narcotic medicines for pain  NSAIDs, medicines for pain and inflammation, like ibuprofen or naproxen  phenothiazines like  chlorpromazine, mesoridazine, prochlorperazine, thioridazine  pioglitazone This list may not describe all possible interactions. Give your health care provider a list of all the medicines, herbs, non-prescription drugs, or dietary supplements you use. Also tell them if you smoke, drink alcohol, or use illegal drugs. Some items may interact with your medicine. What should I watch for while using this medicine? Visit your doctor or health care professional for regular checks on your progress. Tell your health care professional if your symptoms do not start to get better or if they get worse. Do not stop taking except on your health care professional's advice. You may develop a severe reaction. Your health care professional will tell you how much medicine to take. Wear a medical ID bracelet or chain. Carry a card that describes your disease and details of your medicine and dosage times. This medicine can reduce the response of your body to heat or cold. Dress warm in cold weather and stay hydrated in hot weather. If possible, avoid extreme temperatures like saunas, hot tubs, very hot or cold showers, or activities that can cause dehydration such as vigorous exercise. Check with your health care professional if you have severe diarrhea, nausea, and vomiting, or if you sweat a lot. The loss of too much body fluid may make it dangerous for you to take this medicine. You may get drowsy or dizzy. Do not drive, use machinery, or do anything that needs mental alertness until you know how this medicine affects you. Do not stand up or sit up quickly, especially if you are an older patient. This reduces the risk of dizzy or fainting spells. Alcohol may interfere with the effect of this medicine. Avoid alcoholic drinks. Tell your health care professional right away if you have any change in your eyesight. Patients and their families should watch out for new or worsening depression or thoughts of suicide. Also watch  out for sudden changes in feelings such as feeling anxious, agitated, panicky, irritable, hostile, aggressive, impulsive, severely restless, overly excited and hyperactive, or not being able to sleep. If this happens, especially at the beginning of treatment or after a change in dose, call your healthcare professional. This medicine may cause serious skin reactions. They can happen weeks to months after starting the medicine. Contact your health care provider right away if you notice fevers or flu-like symptoms with a rash. The rash may be red or purple and then turn into blisters or peeling of the skin. Or, you might notice a red rash with swelling of the face, lips or lymph nodes in your neck or under your arms. Birth control may not work properly while you are taking this medicine. Talk to your health care professional about using an extra method of birth control. Women should inform their health care professional if they wish to become pregnant or think they might be pregnant. There is a potential for serious side effects and harm to an unborn child. Talk to your health care professional for more information. What side effects may I notice from receiving this medicine? Side effects that you should report to your doctor or health care professional as soon as possible:  allergic reactions like skin rash, itching or hives, swelling of the face, lips, or tongue  blood in the urine  changes in vision  confusion  loss of memory  pain in lower back or side  pain when urinating  redness, blistering, peeling or loosening of the skin, including inside the mouth  signs and symptoms of bleeding such as bloody or black, tarry stools; red or dark brown urine; spitting up blood or brown material that looks like coffee grounds; red spots on the skin; unusual bruising or bleeding from the eyes, gums, or nose  signs and symptoms of increased acid in the body like breathing fast; fast heartbeat; headache;  confusion; unusually weak or tired; nausea, vomiting  suicidal thoughts, mood changes  trouble speaking or understanding  unusual sweating  unusually weak or tired Side effects that usually do not require medical attention (report to your doctor or health care professional if they continue or are bothersome):  dizziness  drowsiness  fever  loss of appetite  nausea, vomiting  pain, tingling, numbness in the hands or feet  stomach pain  tiredness  upset stomach This list may not describe all possible side effects. Call your doctor for medical advice about side effects. You may report side effects to FDA at 1-800-FDA-1088. Where should I keep my medicine? Keep out of the reach of children and pets. Store between 15 and 30 degrees C (59 and 86 degrees F). Protect from moisture. Keep the container tightly closed. Get rid of any unused medicine after the expiration date. To get rid of medicines that are no longer needed or have expired:  Take the medicine to a medicine take-back program. Check with your pharmacy or law enforcement to find a location.  If you cannot return the medicine, check the label or package insert to see if the medicine should be thrown out in the garbage or flushed down the toilet. If you are not sure, ask your health care provider. If it is safe to put it in the trash, empty the medicine out of the container. Mix the medicine with cat litter, dirt, coffee grounds, or other unwanted substance. Seal the mixture in a bag or container. Put it in the trash. NOTE: This sheet is a summary. It may not cover all possible information. If you have questions about this medicine, talk to your doctor, pharmacist, or health care provider.  2021 Elsevier/Gold Standard (2019-11-01 15:41:57)   Migraine Headache A migraine headache is a very strong throbbing pain on one side or both sides of your head. This type of headache can also cause other symptoms. It can last from 4  hours to 3 days. Talk with your doctor about what things may bring on (trigger) this condition. What are the causes? The exact cause of this condition is not known. This condition may be triggered or caused by:  Drinking alcohol.  Smoking.  Taking medicines, such as: ? Medicine used to treat chest pain (nitroglycerin). ? Birth control pills. ? Estrogen. ? Some blood pressure medicines.  Eating or drinking certain products.  Doing physical activity. Other things that may trigger a migraine headache include:  Having a menstrual period.  Pregnancy.  Hunger.  Stress.  Not getting enough sleep or getting too much sleep.  Weather changes.  Tiredness (fatigue). What increases the risk?  Being 74-60 years old.  Being female.  Having a family history of migraine headaches.  Being Caucasian.  Having depression or anxiety.  Being very overweight. What are the signs or symptoms?  A throbbing pain. This pain may: ? Happen in any area of the head, such as on one side or  both sides. ? Make it hard to do daily activities. ? Get worse with physical activity. ? Get worse around bright lights or loud noises.  Other symptoms may include: ? Feeling sick to your stomach (nauseous). ? Vomiting. ? Dizziness. ? Being sensitive to bright lights, loud noises, or smells.  Before you get a migraine headache, you may get warning signs (an aura). An aura may include: ? Seeing flashing lights or having blind spots. ? Seeing bright spots, halos, or zigzag lines. ? Having tunnel vision or blurred vision. ? Having numbness or a tingling feeling. ? Having trouble talking. ? Having weak muscles.  Some people have symptoms after a migraine headache (postdromal phase), such as: ? Tiredness. ? Trouble thinking (concentrating). How is this treated?  Taking medicines that: ? Relieve pain. ? Relieve the feeling of being sick to your stomach. ? Prevent migraine headaches.  Treatment  may also include: ? Having acupuncture. ? Avoiding foods that bring on migraine headaches. ? Learning ways to control your body functions (biofeedback). ? Therapy to help you know and deal with negative thoughts (cognitive behavioral therapy). Follow these instructions at home: Medicines  Take over-the-counter and prescription medicines only as told by your doctor.  Ask your doctor if the medicine prescribed to you: ? Requires you to avoid driving or using heavy machinery. ? Can cause trouble pooping (constipation). You may need to take these steps to prevent or treat trouble pooping:  Drink enough fluid to keep your pee (urine) pale yellow.  Take over-the-counter or prescription medicines.  Eat foods that are high in fiber. These include beans, whole grains, and fresh fruits and vegetables.  Limit foods that are high in fat and sugar. These include fried or sweet foods. Lifestyle  Do not drink alcohol.  Do not use any products that contain nicotine or tobacco, such as cigarettes, e-cigarettes, and chewing tobacco. If you need help quitting, ask your doctor.  Get at least 8 hours of sleep every night.  Limit and deal with stress. General instructions  Keep a journal to find out what may bring on your migraine headaches. For example, write down: ? What you eat and drink. ? How much sleep you get. ? Any change in what you eat or drink. ? Any change in your medicines.  If you have a migraine headache: ? Avoid things that make your symptoms worse, such as bright lights. ? It may help to lie down in a dark, quiet room. ? Do not drive or use heavy machinery. ? Ask your doctor what activities are safe for you.  Keep all follow-up visits as told by your doctor. This is important.      Contact a doctor if:  You get a migraine headache that is different or worse than others you have had.  You have more than 15 headache days in one month. Get help right away if:  Your  migraine headache gets very bad.  Your migraine headache lasts longer than 72 hours.  You have a fever.  You have a stiff neck.  You have trouble seeing.  Your muscles feel weak or like you cannot control them.  You start to lose your balance a lot.  You start to have trouble walking.  You pass out (faint).  You have a seizure. Summary  A migraine headache is a very strong throbbing pain on one side or both sides of your head. These headaches can also cause other symptoms.  This condition may be treated  with medicines and changes to your lifestyle.  Keep a journal to find out what may bring on your migraine headaches.  Contact a doctor if you get a migraine headache that is different or worse than others you have had.  Contact your doctor if you have more than 15 headache days in a month. This information is not intended to replace advice given to you by your health care provider. Make sure you discuss any questions you have with your health care provider. Document Revised: 08/11/2018 Document Reviewed: 06/01/2018 Elsevier Patient Education  2021 [employer].

## 2020-09-10 ENCOUNTER — Other Ambulatory Visit: Payer: Self-pay

## 2020-09-10 ENCOUNTER — Encounter: Payer: Self-pay | Admitting: Family Medicine

## 2020-09-10 ENCOUNTER — Ambulatory Visit: Payer: No Typology Code available for payment source | Admitting: Family Medicine

## 2020-09-10 VITALS — BP 117/79 | HR 78 | Ht 67.5 in | Wt 161.0 lb

## 2020-09-10 DIAGNOSIS — G43709 Chronic migraine without aura, not intractable, without status migrainosus: Secondary | ICD-10-CM

## 2020-09-10 MED ORDER — TOPIRAMATE 25 MG PO TABS: 50.0000 mg | ORAL_TABLET | Freq: Every day | ORAL | 3 refills | Status: AC

## 2020-09-22 ENCOUNTER — Other Ambulatory Visit: Payer: Self-pay | Admitting: Physician Assistant

## 2020-09-22 ENCOUNTER — Ambulatory Visit
Admission: RE | Admit: 2020-09-22 | Discharge: 2020-09-22 | Disposition: A | Discharge status: Home / Self Care | Payer: No Typology Code available for payment source | Source: Ambulatory Visit | Attending: Physician Assistant | Admitting: Physician Assistant

## 2020-09-22 DIAGNOSIS — R7611 Nonspecific reaction to tuberculin skin test without active tuberculosis: Secondary | ICD-10-CM

## 2020-10-14 ENCOUNTER — Encounter: Payer: Self-pay | Admitting: Family Medicine

## 2020-10-14 MED ORDER — RIZATRIPTAN BENZOATE 10 MG PO TABS
10.0000 mg | ORAL_TABLET | ORAL | 0 refills | Status: AC | PRN
Start: 2020-10-14 — End: ?

## 2020-10-21 ENCOUNTER — Other Ambulatory Visit: Payer: Self-pay | Admitting: Neurology

## 2020-10-23 ENCOUNTER — Other Ambulatory Visit: Payer: Self-pay | Admitting: Neurology

## 2020-11-10 ENCOUNTER — Ambulatory Visit
Admit: 2020-11-10 | Discharge: 2020-11-10 | Payer: PRIVATE HEALTH INSURANCE | Attending: Family Medicine | Primary: Family Medicine

## 2020-11-10 ENCOUNTER — Encounter

## 2020-11-10 DIAGNOSIS — Z7689 Persons encountering health services in other specified circumstances: Secondary | ICD-10-CM

## 2020-11-10 MED ORDER — UBRELVY 50 MG PO TABS
50 | ORAL_TABLET | ORAL | 5 refills | Status: DC | PRN
Start: 2020-11-10 — End: 2022-11-29

## 2020-11-10 NOTE — Patient Instructions (Signed)
Patient Education        Well Visit, Ages 18 to 50: Care Instructions  Overview     Well visits can help you stay healthy. Your doctor has checked your overall health and may have suggested ways to take good care of yourself. Your doctor also may have recommended tests. At home, you can help prevent illness withhealthy eating, regular exercise, and other steps.  Follow-up care is a key part of your treatment and safety. Be sure to make and go to all appointments, and call your doctor if you are having problems. It's also a good idea to know your test results and keep alist of the medicines you take.  How can you care for yourself at home?  ??? Get screening tests that you and your doctor decide on. Screening helps find diseases before any symptoms appear.  ??? Eat healthy foods. Choose fruits, vegetables, whole grains, protein, and low-fat dairy foods. Limit fat, especially saturated fat. Reduce salt in your diet.  ??? Limit alcohol. If you are a man, have no more than 2 drinks a day or 14 drinks a week. If you are a woman, have no more than 1 drink a day or 7 drinks a week.  ??? Get at least 30 minutes of physical activity on most days of the week. Walking is a good choice. You also may want to do other activities, such as running, swimming, cycling, or playing tennis or team sports. Discuss any changes in your exercise program with your doctor.  ??? Reach and stay at a healthy weight. This will lower your risk for many problems, such as obesity, diabetes, heart disease, and high blood pressure.  ??? Do not smoke or allow others to smoke around you. If you need help quitting, talk to your doctor about stop-smoking programs and medicines. These can increase your chances of quitting for good.  ??? Care for your mental health. It is easy to get weighed down by worry and stress. Learn strategies to manage stress, like deep breathing and mindfulness, and stay connected with your family and community. If you find you often feel sad or  hopeless, talk with your doctor. Treatment can help.  ??? Talk to your doctor about whether you have any risk factors for sexually transmitted infections (STIs). You can help prevent STIs if you wait to have sex with a new partner (or partners) until you've each been tested for STIs. It also helps if you use condoms (female or female condoms) and if you limit your sex partners to one person who only has sex with you. Vaccines are available for some STIs, such as HPV.  ??? Use birth control if it's important to you to prevent pregnancy. Talk with your doctor about the choices available and what might be best for you.  ??? If you think you may have a problem with alcohol or drug use, talk to your doctor. This includes prescription medicines (such as amphetamines and opioids) and illegal drugs (such as cocaine and methamphetamine). Your doctor can help you figure out what type of treatment is best for you.  ??? Protect your skin from too much sun. When you're outdoors from 10 a.m. to 4 p.m., stay in the shade or cover up with clothing and a hat with a wide brim. Wear sunglasses that block UV rays. Even when it's cloudy, put broad-spectrum sunscreen (SPF 30 or higher) on any exposed skin.  ??? See a dentist one or two times a year for   checkups and to have your teeth cleaned.  ??? Wear a seat belt in the car.  When should you call for help?  Watch closely for changes in your health, and be sure to contact your doctor if you have any problems or symptoms that concern you.  Where can you learn more?  Go to [email or web address] and sign in to your MyChart account. Enter P072 in the Search Health Information box to learn more about "Well Visit, Ages 18 to 50: Care Instructions."     If you do not have an account, please click on the "Sign Up Now" link.  Current as of: February 06, 2020??????????????????????????????Content Version: 13.3  ?? 2006-2022 [employer].   Care instructions adapted under license by [facility]. If  you have questions about a medical condition or this instruction, always ask your healthcare professional. [employer] disclaims any warranty or liability for your use of this information.

## 2020-11-10 NOTE — Progress Notes (Signed)
11/10/2020    Erica Gibbs (DOB:  April 28, 1996) is a 25 y.o. female, here for evaluation of the following medical concerns:    HPI    Well Adult Physical: Patient here for a comprehensive physical exam.The patient reports no problems  Do you take any herbs or supplements that were not prescribed by a doctor? no Are you taking calcium supplements? not applicable Are you taking aspirin daily? not applicable    Sexual activity: has sex with males; IUD  Diet: Healthy  Exercise: aerobics 3 time(s) per week  Seatbelt use: yes    Review of Systems   Constitutional: Negative for activity change, appetite change, fatigue and unexpected weight change.   HENT: Negative for hearing loss.    Eyes: Negative for visual disturbance.   Respiratory: Negative for chest tightness, shortness of breath and stridor.    Cardiovascular: Negative for chest pain and palpitations.   Gastrointestinal: Negative for abdominal pain, blood in stool, constipation and diarrhea.   Endocrine: Negative for polydipsia, polyphagia and polyuria.   Genitourinary: Negative for dysuria, genital sores, menstrual problem, pelvic pain, vaginal bleeding, vaginal discharge and vaginal pain.   Musculoskeletal: Negative for arthralgias and back pain.   Skin: Negative for rash.   Allergic/Immunologic: Negative for environmental allergies.   Neurological: Positive for headaches (migraines; followed with neurology @ [facility]). Negative for dizziness and syncope.   Hematological: Negative for adenopathy.   Psychiatric/Behavioral: Negative for dysphoric mood. The patient is not nervous/anxious.        Prior to Visit Medications    Medication Sig Taking? Authorizing Provider   rizatriptan (MAXALT) 10 MG tablet Take 10 mg by mouth once as needed for Migraine May repeat in 2 hours if needed Yes Historical Provider, MD   montelukast (SINGULAIR) 10 MG tablet Take 10 mg by mouth nightly Yes Historical Provider, MD   topiramate (TOPAMAX SPRINKLE) 25 MG capsule Take 25 mg by mouth 2  times daily Yes Historical Provider, MD   levocetirizine (XYZAL) 5 MG tablet Take 5 mg by mouth nightly Yes Historical Provider, MD   Fremanezumab-vfrm (AJOVY) 225 MG/1.5ML SOAJ Inject into the skin Once monthly Yes Historical Provider, MD   ondansetron (ZOFRAN) 4 MG tablet Take 4 mg by mouth every 8 hours as needed for Nausea or Vomiting prn Yes Historical Provider, MD   Ubrogepant (UBRELVY) 50 MG TABS Take 1 tablet by mouth as needed (migraine) Yes Ferd Glassing, DO        No Known Allergies    Past Medical History:   Diagnosis Date   ??? Migraines        History reviewed. No pertinent surgical history.    Social History     Socioeconomic History   ??? Marital status: Single     Spouse name: Not on file   ??? Number of children: Not on file   ??? Years of education: Not on file   ??? Highest education level: Not on file   Occupational History   ??? Not on file   Tobacco Use   ??? Smoking status: Never Smoker   ??? Smokeless tobacco: Never Used   Vaping Use   ??? Vaping Use: Never used   Substance and Sexual Activity   ??? Alcohol use: Yes     Alcohol/week: 3.0 standard drinks     Types: 3 Glasses of wine per week     Comment: occ   ??? Drug use: Never   ??? Sexual activity: Not Currently   Other Topics  Concern   ??? Not on file   Social History Narrative   ??? Not on file     Social Determinants of Health     Financial Resource Strain: Low Risk    ??? Difficulty of Paying Living Expenses: Not hard at all   Food Insecurity: No Food Insecurity   ??? Worried About Programme researcher, broadcasting/film/video in the Last Year: Never true   ??? Ran Out of Food in the Last Year: Never true   Transportation Needs:    ??? Lack of Transportation (Medical): Not on file   ??? Lack of Transportation (Non-Medical): Not on file   Physical Activity:    ??? Days of Exercise per Week: Not on file   ??? Minutes of Exercise per Session: Not on file   Stress:    ??? Feeling of Stress : Not on file   Social Connections:    ??? Frequency of Communication with Friends and Family: Not on file   ??? Frequency of  Social Gatherings with Friends and Family: Not on file   ??? Attends Religious Services: Not on file   ??? Active Member of Clubs or Organizations: Not on file   ??? Attends Banker Meetings: Not on file   ??? Marital Status: Not on file   Intimate Partner Violence:    ??? Fear of Current or Ex-Partner: Not on file   ??? Emotionally Abused: Not on file   ??? Physically Abused: Not on file   ??? Sexually Abused: Not on file   Housing Stability:    ??? Unable to Pay for Housing in the Last Year: Not on file   ??? Number of Places Lived in the Last Year: Not on file   ??? Unstable Housing in the Last Year: Not on file        Family History   Problem Relation Age of Onset   ??? Hypertension Mother    ??? Graves Disease Mother    ??? Hypertension Father        Vitals:    11/10/20 1357   BP: 122/69   Pulse: 69   Temp: 98 ??F (36.7 ??C)   TempSrc: Temporal   Weight: 164 lb 12.8 oz (74.8 kg)   Height: 5\' 7"  (1.702 m)     Estimated body mass index is 25.81 kg/m?? as calculated from the following:    Height as of this encounter: 5\' 7"  (1.702 m).    Weight as of this encounter: 164 lb 12.8 oz (74.8 kg).    Physical Exam  Vitals reviewed.   Constitutional:       Appearance: Normal appearance. She is normal weight.   HENT:      Head: Normocephalic and atraumatic.      Right Ear: Tympanic membrane, ear canal and external ear normal.      Left Ear: Tympanic membrane, ear canal and external ear normal.      Nose: Nose normal.   Eyes:      Extraocular Movements: Extraocular movements intact.      Conjunctiva/sclera: Conjunctivae normal.   Cardiovascular:      Rate and Rhythm: Normal rate and regular rhythm.      Pulses: Normal pulses.      Heart sounds: Normal heart sounds.   Pulmonary:      Effort: Pulmonary effort is normal.      Breath sounds: Normal breath sounds.   Abdominal:      General: Abdomen is flat. Bowel sounds are  normal.      Palpations: Abdomen is soft.   Musculoskeletal:         General: Normal range of motion.      Cervical back:  Normal range of motion and neck supple.   Skin:     General: Skin is warm and dry.      Capillary Refill: Capillary refill takes less than 2 seconds.      Findings: No rash.   Neurological:      General: No focal deficit present.      Mental Status: She is alert and oriented to person, place, and time. Mental status is at baseline.   Psychiatric:         Mood and Affect: Mood normal.         Behavior: Behavior normal.         Thought Content: Thought content normal.         Judgment: Judgment normal.       Separate Identifiable issues addressed today:      ASSESSMENT/PLAN:  Quanna was seen today for establish care, annual exam and immunizations.    Diagnoses and all orders for this visit:    Encounter to establish care with new doctor: Vitals reviewed and within normal limits.  BMI nonobese.  Depression screen negative.  Reviewed diet exercise regimen patient on appropriate lifestyle modifications.    Chronic migraine without aura without status migrainosus, not intractable: Followed by neurology at [facility] prior to relocating.  Significant improvement on the Ajovy, but does require occasional as needed Maxalt.  Maxalt does not work well.  We will see if we can get her as needed Ubrelvy.  -     Ubrogepant (UBRELVY) 50 MG TABS; Take 1 tablet by mouth as needed (migraine)  -     AFL - Serina Cowper, DO, Neurology, [place]    Encounter for hepatitis C screening test for low risk patient  -     Hepatitis C Antibody; Future    Screening for HIV (human immunodeficiency virus)  -     HIV Screen; Future    Screen for STD (sexually transmitted disease): Recently screened and negative.    Screening for cervical cancer: Appointment with gynecology tomorrow.    Return in about 1 year (around 11/10/2021) for [employer].    An  electronic signature was used to authenticate this note.    --Ferd Glassing, DO on 11/10/2020 at 2:34 PM

## 2020-11-11 ENCOUNTER — Ambulatory Visit
Admit: 2020-11-11 | Discharge: 2020-11-11 | Payer: PRIVATE HEALTH INSURANCE | Attending: Obstetrics & Gynecology | Primary: Family Medicine

## 2020-11-11 DIAGNOSIS — Z01419 Encounter for gynecological examination (general) (routine) without abnormal findings: Secondary | ICD-10-CM

## 2020-11-11 LAB — HIV SCREEN
HIV ANTIGEN: NONREACTIVE
HIV Ag/Ab: NONREACTIVE
HIV-1 Antibody: NONREACTIVE
HIV-2 Ab: NONREACTIVE

## 2020-11-11 LAB — HEPATITIS C ANTIBODY: Hep C Ab Interp: NONREACTIVE

## 2020-11-11 NOTE — Progress Notes (Signed)
Pt is here to establish new ob/gyn.  She is in year 2/10 of [employer] IUD.  She is having regular monthly menses but has some cramps associated with menses.  She has a h/o menstrual migraines so she is hesitant about taking hormones.  Not sexually active.  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  She is up to date with paps.  Low risk- no smoking, no h/o abnormal paps, no risky habits.  Assess:  Normal exam  Plan:  Call with questions/problems.  F/u annual gyn exam  20 min >50% of time was spent discussing findings, management, and treatment options.

## 2020-11-13 ENCOUNTER — Encounter

## 2020-11-14 LAB — HEPATITIS B SURFACE ANTIBODY: Hep B S Ab: >1000 m[IU]/mL

## 2020-11-27 ENCOUNTER — Other Ambulatory Visit: Payer: Self-pay | Admitting: Family Medicine

## 2020-12-09 NOTE — Telephone Encounter (Signed)
Would you like her to schedule an appt or just do a nurse/lab visit for a urine sample only?  Last seen 11/11/20

## 2020-12-09 NOTE — Telephone Encounter (Signed)
From: June Leap  To: Dr. Geryl Councilman Flick  Sent: 12/09/2020 2:24 PM EDT  Subject: UTI?     Hi dr. Emerson Monte,    I???m concerned I might have a uti (urinating frequently, burning), but I???ve never had one before so I???m not exactly sure. Can I come in tomorrow after 3pm or Thursday afternoon to get checked out?

## 2020-12-11 ENCOUNTER — Encounter: Admit: 2020-12-11 | Discharge: 2020-12-11 | Payer: PRIVATE HEALTH INSURANCE | Primary: Family Medicine

## 2020-12-11 DIAGNOSIS — R300 Dysuria: Secondary | ICD-10-CM

## 2020-12-11 LAB — POCT URINALYSIS DIPSTICK
Bilirubin, UA: NEGATIVE
Blood, UA POC: NEGATIVE
Glucose, UA POC: NEGATIVE
Ketones, UA: NEGATIVE
Nitrite, UA: NEGATIVE
Protein, UA POC: NEGATIVE
Spec Grav, UA: 1.01
Urobilinogen, UA: 0.2
pH, UA: 7

## 2020-12-11 NOTE — Progress Notes (Deleted)
   PATIENT: Alicia Benton DOB: 04/04/1996  REASON FOR VISIT: follow up HISTORY FROM: patient  Virtual Visit via Telephone Note  I connected with Alicia Benton on 12/11/20 at  7:30 AM EDT by telephone and verified that I am speaking with the correct person using two identifiers.   I discussed the limitations, risks, security and privacy concerns of performing an evaluation and management service by telephone and the availability of in person appointments. I also discussed with the patient that there may be a patient responsible charge related to this service. The patient expressed understanding and agreed to proceed.   History of Present Illness:  12/11/20 ALL (Mychart): Alicia Benton is a 25 y.o. female here today for follow up for migraines. We continued Ajovy and added topiramate 50mg  daily at last visit.    09/10/20 ALL:  Alicia Benton is a 25 y.o. female here today for follow up for migraines. She continued Ajovy since last visit in 01/2020. She stopped nortriptyline for a few months but the was diagnosed with Covid in early 2022. She restarted nortriptyline and titrated dose. She just increased dose from 20mg  to 40mg  about 10 days ago. She feels that it is making her really sleepy. She does not feel it has helped with headaches. Rizatriptan was not effective and switched to naratriptan. She has taken it twice. She felt that it helped some but migraine returns the next day and she felt more dizzy. She likes rizatriptan better. Tylenol or ibuprofen usually eases pain. She usally takes OTCs 3 times a week unless during her menstrual cycle and she will take daily. 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 She feels that trying to figure out the logistics of moving to [place] and getting loans has added more stress but feels better, overall, then in previous semesters. She uses [employer].    Observations/Objective:  Generalized: Well developed, in no acute distress  Mentation: Alert oriented to  time, place, history taking. Follows all commands speech and language fluent   Assessment and Plan:  25 y.o. year old female  has a past medical history of Migraines. here with  No diagnosis found.  No orders of the defined types were placed in this encounter.   No orders of the defined types were placed in this encounter.    Follow Up Instructions:  I discussed the assessment and treatment plan with the patient. The patient was provided an opportunity to ask questions and all were answered. The patient agreed with the plan and demonstrated an understanding of the instructions.   The patient was advised to call back or seek an in-person evaluation if the symptoms worsen or if the condition fails to improve as anticipated.  I provided *** minutes of non-face-to-face time during this encounter. Patient located at their place of residence during Mychart visit. Provider is in the office.    February, NP

## 2020-12-11 NOTE — Progress Notes (Signed)
Urine specimen received for possible UTI.     UA performed and CX sent.

## 2020-12-12 MED ORDER — CIPROFLOXACIN HCL 500 MG PO TABS
500 MG | ORAL_TABLET | Freq: Two times a day (BID) | ORAL | 0 refills | Status: AC
Start: 2020-12-12 — End: 2020-12-19

## 2020-12-13 LAB — CULTURE, URINE: Urine Culture, Routine: NO GROWTH

## 2020-12-16 ENCOUNTER — Telehealth: Payer: No Typology Code available for payment source | Admitting: Family Medicine

## 2020-12-16 DIAGNOSIS — G43709 Chronic migraine without aura, not intractable, without status migrainosus: Secondary | ICD-10-CM

## 2020-12-22 MED ORDER — MONTELUKAST SODIUM 10 MG PO TABS
10 MG | ORAL_TABLET | Freq: Every evening | ORAL | 5 refills | Status: DC
Start: 2020-12-22 — End: 2021-01-06

## 2020-12-22 MED ORDER — LEVOCETIRIZINE DIHYDROCHLORIDE 5 MG PO TABS
5 MG | ORAL_TABLET | Freq: Every evening | ORAL | 5 refills | Status: AC
Start: 2020-12-22 — End: 2021-08-03

## 2021-01-06 MED ORDER — MONTELUKAST SODIUM 10 MG PO TABS
10 MG | ORAL_TABLET | Freq: Every evening | ORAL | 5 refills | Status: AC
Start: 2021-01-06 — End: 2021-11-12

## 2021-01-26 ENCOUNTER — Encounter: Attending: Obstetrics & Gynecology | Primary: Family Medicine

## 2021-02-26 ENCOUNTER — Encounter: Payer: PRIVATE HEALTH INSURANCE | Attending: Family Medicine | Primary: Family Medicine

## 2021-02-26 NOTE — Progress Notes (Deleted)
02/26/2021     Erica Gibbs (DOB:  1996/01/28) is a 25 y.o. female, here for evaluation of the following medical concerns:    HPI  {Visit Chronic CHP (Optional):22185}    Review of Systems    Prior to Visit Medications    Medication Sig Taking? Authorizing Provider   montelukast (SINGULAIR) 10 MG tablet Take 1 tablet by mouth nightly  Ferd Glassing, DO   levocetirizine (XYZAL) 5 MG tablet Take 1 tablet by mouth nightly  Ferd Glassing, DO   rizatriptan (MAXALT) 10 MG tablet Take 10 mg by mouth once as needed for Migraine May repeat in 2 hours if needed  Historical Provider, MD   topiramate (TOPAMAX SPRINKLE) 25 MG capsule Take 25 mg by mouth 2 times daily  Historical Provider, MD   Fremanezumab-vfrm (AJOVY) 225 MG/1.5ML SOAJ Inject into the skin Once monthly  Historical Provider, MD   ondansetron (ZOFRAN) 4 MG tablet Take 4 mg by mouth every 8 hours as needed for Nausea or Vomiting prn  Historical Provider, MD   Ubrogepant (UBRELVY) 50 MG TABS Take 1 tablet by mouth as needed (migraine)  Ferd Glassing, DO        Social History     Tobacco Use    Smoking status: Never    Smokeless tobacco: Never   Substance Use Topics    Alcohol use: Yes     Alcohol/week: 3.0 standard drinks     Types: 3 Glasses of wine per week     Comment: occ        There were no vitals filed for this visit.  Estimated body mass index is 25.37 kg/m?? as calculated from the following:    Height as of 11/11/20: 5\' 7"  (1.702 m).    Weight as of 11/11/20: 162 lb (73.5 kg).    Physical Exam    ASSESSMENT/PLAN:  1. Laceration of left thumb without foreign body without damage to nail, initial encounter  ***    No follow-ups on file.    An electronic signature was used to authenticate this note.    --01/12/21, DO on 02/25/2021 at 8:01 PM

## 2021-06-18 ENCOUNTER — Ambulatory Visit
Admit: 2021-06-18 | Discharge: 2021-06-18 | Payer: PRIVATE HEALTH INSURANCE | Attending: Family Medicine | Primary: Family Medicine

## 2021-06-18 DIAGNOSIS — M25562 Pain in left knee: Secondary | ICD-10-CM

## 2021-06-18 NOTE — Patient Instructions (Signed)
Address: [address], [place], [place] [address]  Hours:   Open now   ??   Add full hours        Phone: [phone]  Suggest an edit

## 2021-06-18 NOTE — Progress Notes (Signed)
06/18/2021     Erica Gibbs (DOB:  [birth year]) is a 26 y.o. female, here for evaluation of the following medical concerns:    HPI  Knee Pain  Patient presents with a knee injury involving the left knee. Onset of the symptoms was a week ago. Inciting event:  Sudden onset while doing a deep knee flexion exercise . Current symptoms include crepitus sensation, foreign body sensation, giving out, popping sensation, and swelling. Pain is aggravated by going up and down stairs, kneeling, squatting, and twisting motions . Patient has had no prior knee problems. Evaluation to date: none. Treatment to date: avoidance of offending activity.    Review of Systems   Constitutional:  Negative for chills and fever.   Musculoskeletal:  Positive for arthralgias.   Skin:  Negative for color change and wound.   Neurological:  Negative for weakness and numbness.     Prior to Visit Medications    Medication Sig Taking? Authorizing Provider   montelukast (SINGULAIR) 10 MG tablet Take 1 tablet by mouth nightly Yes Ferd Glassing, DO   levocetirizine (XYZAL) 5 MG tablet Take 1 tablet by mouth nightly Yes Ferd Glassing, DO   rizatriptan (MAXALT) 10 MG tablet Take 10 mg by mouth once as needed for Migraine May repeat in 2 hours if needed Yes Historical Provider, MD   topiramate (TOPAMAX SPRINKLE) 25 MG capsule Take 25 mg by mouth 2 times daily Yes Historical Provider, MD   Fremanezumab-vfrm (AJOVY) 225 MG/1.5ML SOAJ Inject into the skin Once monthly Yes Historical Provider, MD   ondansetron (ZOFRAN) 4 MG tablet Take 4 mg by mouth every 8 hours as needed for Nausea or Vomiting prn Yes Historical Provider, MD   Ubrogepant (UBRELVY) 50 MG TABS Take 1 tablet by mouth as needed (migraine) Yes Ferd Glassing, DO        Social History     Tobacco Use    Smoking status: Never    Smokeless tobacco: Never   Substance Use Topics    Alcohol use: Yes     Alcohol/week: 3.0 standard drinks     Types: 3 Glasses of wine per week     Comment: occ        Vitals:     06/18/21 1112   BP: 129/76   Pulse: 60   Temp: 98.1 ??F (36.7 ??C)   TempSrc: Temporal   Weight: 162 lb 12.8 oz (73.8 kg)   Height: 5\' 7"  (1.702 m)     Estimated body mass index is 25.5 kg/m?? as calculated from the following:    Height as of this encounter: 5\' 7"  (1.702 m).    Weight as of this encounter: 162 lb 12.8 oz (73.8 kg).    Physical Exam  Constitutional:       Appearance: Normal appearance. She is normal weight.   HENT:      Head: Normocephalic and atraumatic.   Eyes:      Extraocular Movements: Extraocular movements intact.      Conjunctiva/sclera: Conjunctivae normal.   Cardiovascular:      Rate and Rhythm: Normal rate.   Pulmonary:      Effort: Pulmonary effort is normal.   Musculoskeletal:         General: Swelling and tenderness (Left lateral joint line) present. No deformity (Ligaments appear grossly intact, does have positive duck walk).   Skin:     General: Skin is warm and dry.   Neurological:      Mental Status: She is  alert. Mental status is at baseline.       ASSESSMENT/PLAN:  1. Pain in lateral portion of left knee: Mechanism of of injury and exam today highly suggestive of meniscal injury.  Referring to orthopedic surgery for evaluation.  - External Referral To Orthopedic Surgery    Return if symptoms worsen or fail to improve.    An electronic signature was used to authenticate this note.    --Ferd Glassing, DO on 06/18/2021 at 11:24 AM

## 2021-06-22 ENCOUNTER — Ambulatory Visit: Admit: 2021-06-22 | Discharge: 2021-06-22 | Payer: PRIVATE HEALTH INSURANCE | Attending: Sports Medicine

## 2021-06-22 ENCOUNTER — Inpatient Hospital Stay: Admit: 2021-06-22 | Payer: PRIVATE HEALTH INSURANCE

## 2021-06-22 DIAGNOSIS — M25562 Pain in left knee: Secondary | ICD-10-CM

## 2021-06-22 NOTE — Unmapped (Signed)
Orthopaedic Surgery Clinic Note      Reason for Visit:  L knee pain    Subjective:  Mia Brooks is a 26 y.o. female who is here for left knee pain that started 2 weeks ago. She was doing nordic curls in the gym and after a few sets she noticed lateral sided knee pain, adjacent to the knee joint. She didn't notice any pops. The knee was swollen afterwards. She then went climbing a few days later and banged her knee cap a few times. Since then she has been unable to climb at the climbing gym and has pain with prolonged walking and standing. She gets pain mostly with activities requiring knee extension. She does not have any symptoms of instability. She has catching but no frank locking. 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    Objective:  Physical Exam  LLE: Mild effusion and minimal anterior ecchymosis. Tender to palpation at the lateral joint line and Gerdy's tubercle. There is no pain with patellar translation. There is pain when bringing the knee from flexion into extension. No laxity or pain to varus, valgus, Lachman, or drawer testing. Pain with Thessaly test.     Imaging:  Left knee xrays, 4 views, taken and reviewed today, demonstrate no acute osseous or degenerative abnormalities.    Assessment and Plan:  Mia Brooks is a 26 y.o. female with    Left knee pain, suspected meniscus tear vs capsular sprain / IT strain:  Given her effusion, localizable joint line pain, and persistent pain with daily activities, we recommended an MRI to evaluate for a meniscus tear. We will plan to see her back after the MRI.    Bernard Slayden Konrad Saha, MD  Orthopaedic Surgery Resident

## 2021-06-23 NOTE — Unmapped (Signed)
Status: Approved NPR  Per: Junius Roads @ [facility] @ [phone]  Ref#: Information listed above  Per patients request sent a my chart message with all the information regarding the scheduling of her MRI. Patient will schedule appointments accordingly.

## 2021-06-24 DIAGNOSIS — R600 Localized edema: Secondary | ICD-10-CM

## 2021-06-25 ENCOUNTER — Inpatient Hospital Stay
Admit: 2021-06-25 | Discharge: 2021-06-29 | Payer: PRIVATE HEALTH INSURANCE | Attending: Student in an Organized Health Care Education/Training Program

## 2021-06-29 ENCOUNTER — Ambulatory Visit: Admit: 2021-06-29 | Discharge: 2021-06-29 | Payer: PRIVATE HEALTH INSURANCE | Attending: Sports Medicine

## 2021-06-29 DIAGNOSIS — M25562 Pain in left knee: Secondary | ICD-10-CM

## 2021-06-29 MED ORDER — diclofenac (VOLTAREN) 50 MG EC tablet
50 | ORAL_TABLET | Freq: Two times a day (BID) | ORAL | 0 refills | Status: AC
Start: 2021-06-29 — End: ?

## 2021-06-29 NOTE — Unmapped (Signed)
[facility]             [facility]    PATIENT NAME:      Mia Brooks, Mia Brooks                   MRN:                 [id number]  DATE OF BIRTH:     1995-11-28                    CSN:                 [id number]  PROVIDER:          Andree ElkBRIAN Keniel Ralston, MD               VISIT DATE:          06/29/2021                                   OFFICE NOTE      She is here for her left knee MRI results that just shows some impingement of Hoffa's fat pad, which would be consistent with the symptoms that she is having.  We are going to get her into physical therapy.  I will see her back as needed.        Andree ElkBRIAN Yardley Beltran, MD      BG/AQ  DD:?? 06/29/2021 17:00:14  DT:?? 06/30/2021 10:01:36    JOB#:  [id number]

## 2021-06-30 ENCOUNTER — Encounter

## 2021-06-30 ENCOUNTER — Encounter
Admit: 2021-06-30 | Discharge: 2021-06-30 | Payer: PRIVATE HEALTH INSURANCE | Attending: Family Medicine | Primary: Family Medicine

## 2021-06-30 DIAGNOSIS — N3000 Acute cystitis without hematuria: Secondary | ICD-10-CM

## 2021-06-30 LAB — POCT URINALYSIS DIPSTICK
Bilirubin, UA: 0
Blood, UA POC: 0
Glucose, UA POC: 0
Ketones, UA: 0
Nitrite, UA: 0
Protein, UA POC: 0
Spec Grav, UA: 1.02
Urobilinogen, UA: 0.2
pH, UA: 6.5

## 2021-06-30 MED ORDER — CEPHALEXIN 500 MG PO CAPS
500 MG | ORAL_CAPSULE | Freq: Three times a day (TID) | ORAL | 0 refills | Status: AC
Start: 2021-06-30 — End: 2021-07-05

## 2021-06-30 MED ORDER — PROMETHAZINE HCL 25 MG PO TABS
25 MG | ORAL_TABLET | Freq: Three times a day (TID) | ORAL | 0 refills | Status: AC | PRN
Start: 2021-06-30 — End: 2021-07-07

## 2021-06-30 NOTE — Progress Notes (Signed)
06/30/2021     Erica Gibbs (DOB:  Mar 26, 1996) is a 26 y.o. female, here for evaluation of the following medical concerns:    HPI  Urinary Tract Infection  Patient complains of dysuria, frequency, urgency She has had symptoms for 1 week. Patient has a migraine, but no other complaints. Patient denies back pain, cough, fever, rhinitis, sorethroat, stomach ache, and vaginal discharge. Patient does not have a history of recurrent UTI.  Patient does not have a history of pyelonephritis.     Review of Systems   Constitutional:  Negative for chills and fever.   Gastrointestinal:  Negative for nausea and vomiting.   Genitourinary:  Positive for dysuria and frequency. Negative for difficulty urinating and hematuria.   Neurological:  Positive for headaches (known migraine disorder - she is going to go home and take a [place]).     Prior to Visit Medications    Medication Sig Taking? Authorizing Provider   diclofenac (VOLTAREN) 50 MG EC tablet Take 50 mg by mouth 2 times daily Yes Historical Provider, MD   cephALEXin (KEFLEX) 500 MG capsule Take 1 capsule by mouth 3 times daily for 5 days Yes Ferd Glassing, DO   promethazine (PHENERGAN) 25 MG tablet Take 1 tablet by mouth 3 times daily as needed for Nausea Yes Ferd Glassing, DO   montelukast (SINGULAIR) 10 MG tablet Take 1 tablet by mouth nightly Yes Ferd Glassing, DO   levocetirizine (XYZAL) 5 MG tablet Take 1 tablet by mouth nightly Yes Ferd Glassing, DO   topiramate (TOPAMAX SPRINKLE) 25 MG capsule Take 25 mg by mouth 2 times daily Yes Historical Provider, MD   Fremanezumab-vfrm (AJOVY) 225 MG/1.5ML SOAJ Inject into the skin Once monthly Yes Historical Provider, MD   ondansetron (ZOFRAN) 4 MG tablet Take 4 mg by mouth every 8 hours as needed for Nausea or Vomiting prn Yes Historical Provider, MD   Ubrogepant (UBRELVY) 50 MG TABS Take 1 tablet by mouth as needed (migraine) Yes Ferd Glassing, DO   rizatriptan (MAXALT) 10 MG tablet Take 10 mg by mouth once as needed for Migraine May  repeat in 2 hours if needed  Patient not taking: Reported on 06/30/2021  Historical Provider, MD        Social History     Tobacco Use    Smoking status: Never    Smokeless tobacco: Never   Substance Use Topics    Alcohol use: Yes     Alcohol/week: 3.0 standard drinks     Types: 3 Glasses of wine per week     Comment: occ        Vitals:    06/30/21 1053   BP: 113/76   Pulse: 73   Temp: 97.7 ??F (36.5 ??C)   TempSrc: Temporal   Weight: 163 lb 3.2 oz (74 kg)   Height: 5\' 7"  (1.702 m)     Estimated body mass index is 25.56 kg/m?? as calculated from the following:    Height as of this encounter: 5\' 7"  (1.702 m).    Weight as of this encounter: 163 lb 3.2 oz (74 kg).    Physical Exam  Vitals reviewed.   Constitutional:       Appearance: Normal appearance. She is normal weight.   HENT:      Head: Normocephalic and atraumatic.      Right Ear: Tympanic membrane, ear canal and external ear normal.      Left Ear: Tympanic membrane and external ear normal.  Nose: Nose normal.      Mouth/Throat:      Mouth: Mucous membranes are moist.      Pharynx: Oropharynx is clear.   Eyes:      Extraocular Movements: Extraocular movements intact.      Conjunctiva/sclera: Conjunctivae normal.   Cardiovascular:      Rate and Rhythm: Normal rate.   Pulmonary:      Effort: Pulmonary effort is normal.   Musculoskeletal:         General: No deformity.      Cervical back: Normal range of motion and neck supple.   Skin:     General: Skin is warm and dry.   Neurological:      Mental Status: She is alert. Mental status is at baseline.   Psychiatric:         Mood and Affect: Mood normal.         Behavior: Behavior normal.       ASSESSMENT/PLAN:  1. Acute cystitis without hematuria: Symptoms and UA suggestive of UTI.  Antibiotics as below.  Counseled on reasons to call or present to the office for additional evaluation.  Follow-up as needed.  - POCT Urinalysis no Micro  - cephALEXin (KEFLEX) 500 MG capsule; Take 1 capsule by mouth 3 times daily for 5  days  Dispense: 15 capsule; Refill: 0      Return if symptoms worsen or fail to improve.    An electronic signature was used to authenticate this note.    --Ferd Glassing, DO on 06/30/2021 at 11:18 AM

## 2021-06-30 NOTE — Telephone Encounter (Signed)
From: June Leap  To: Dr. Ferd Glassing  Sent: 06/30/2021 11:41 AM EST  Subject: Pharmacy change    Can you please resend everything to the [facility] [address]? [facility] down :(

## 2021-06-30 NOTE — Telephone Encounter (Signed)
Spoke with pt she stated they can now fill her scripts

## 2021-07-06 MED ORDER — NITROFURANTOIN MONOHYD MACRO 100 MG PO CAPS
100 MG | ORAL_CAPSULE | Freq: Two times a day (BID) | ORAL | 0 refills | Status: AC
Start: 2021-07-06 — End: 2021-07-11

## 2021-08-03 MED ORDER — LEVOCETIRIZINE DIHYDROCHLORIDE 5 MG PO TABS
5 MG | ORAL_TABLET | Freq: Every evening | ORAL | 1 refills | Status: DC
Start: 2021-08-03 — End: 2021-11-12

## 2021-08-03 NOTE — Telephone Encounter (Signed)
Medication:   Requested Prescriptions     Pending Prescriptions Disp Refills    levocetirizine (XYZAL) 5 MG tablet [Pharmacy Med Name: LEVOCETIRIZINE 5 MG TABLET] 90 tablet 1     Sig: TAKE 1 TABLET BY MOUTH NIGHTLY        Last Filled:  12/22/20    Patient Phone Number: [phone] (home)     Last appt: 06/30/2021 Return if symptoms worsen or fail to improve.  Next appt: Visit date not found    Last OARRS: No flowsheet data found.

## 2021-08-12 IMAGING — CR DG CHEST 2V
2 series · 2 of 2 positions shown · non-contrast
Comparison: No priors.

CLINICAL DATA: 24-year-old female with history of positive TB test.

EXAM:
CHEST - 2 VIEW

[w chest pa]
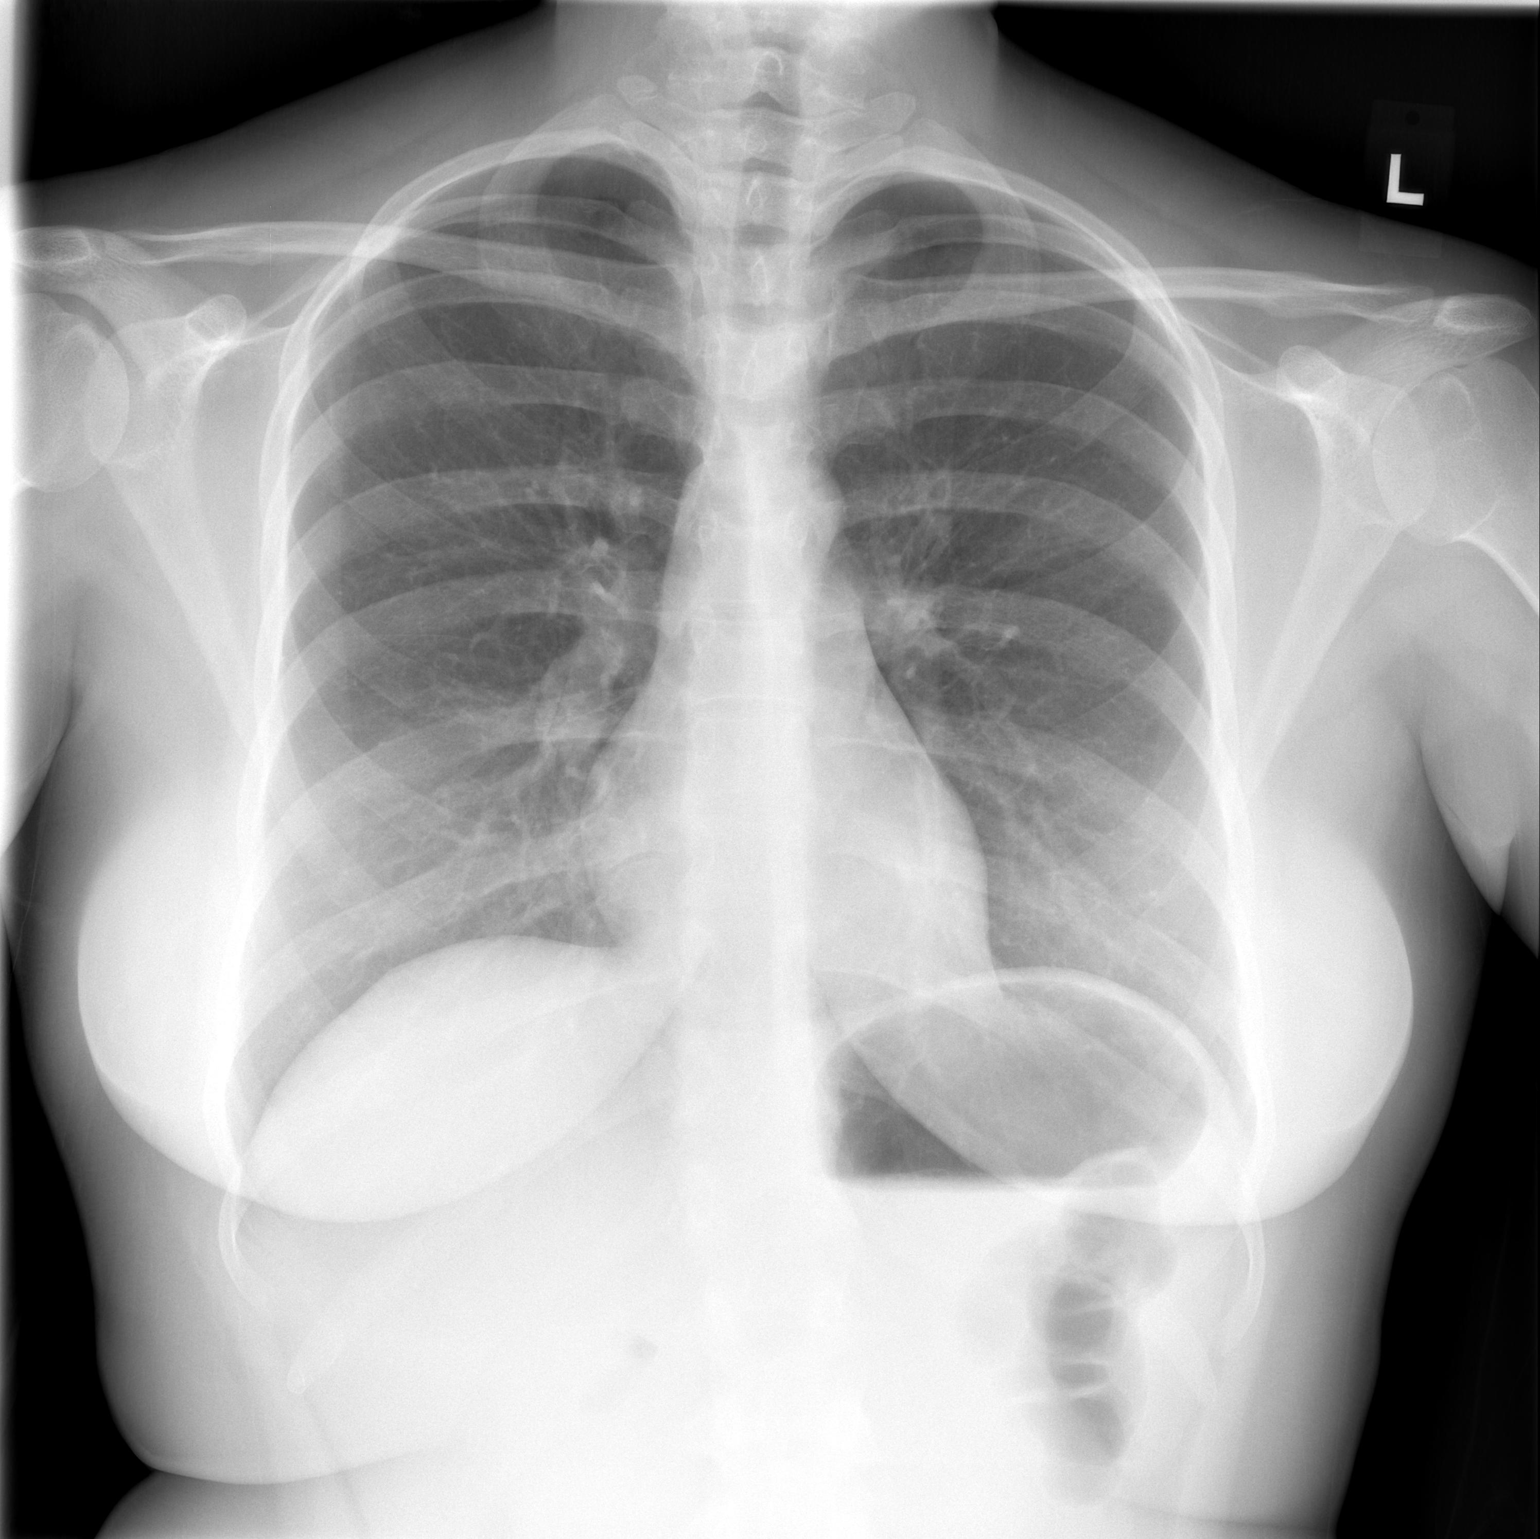

[w chest lat]
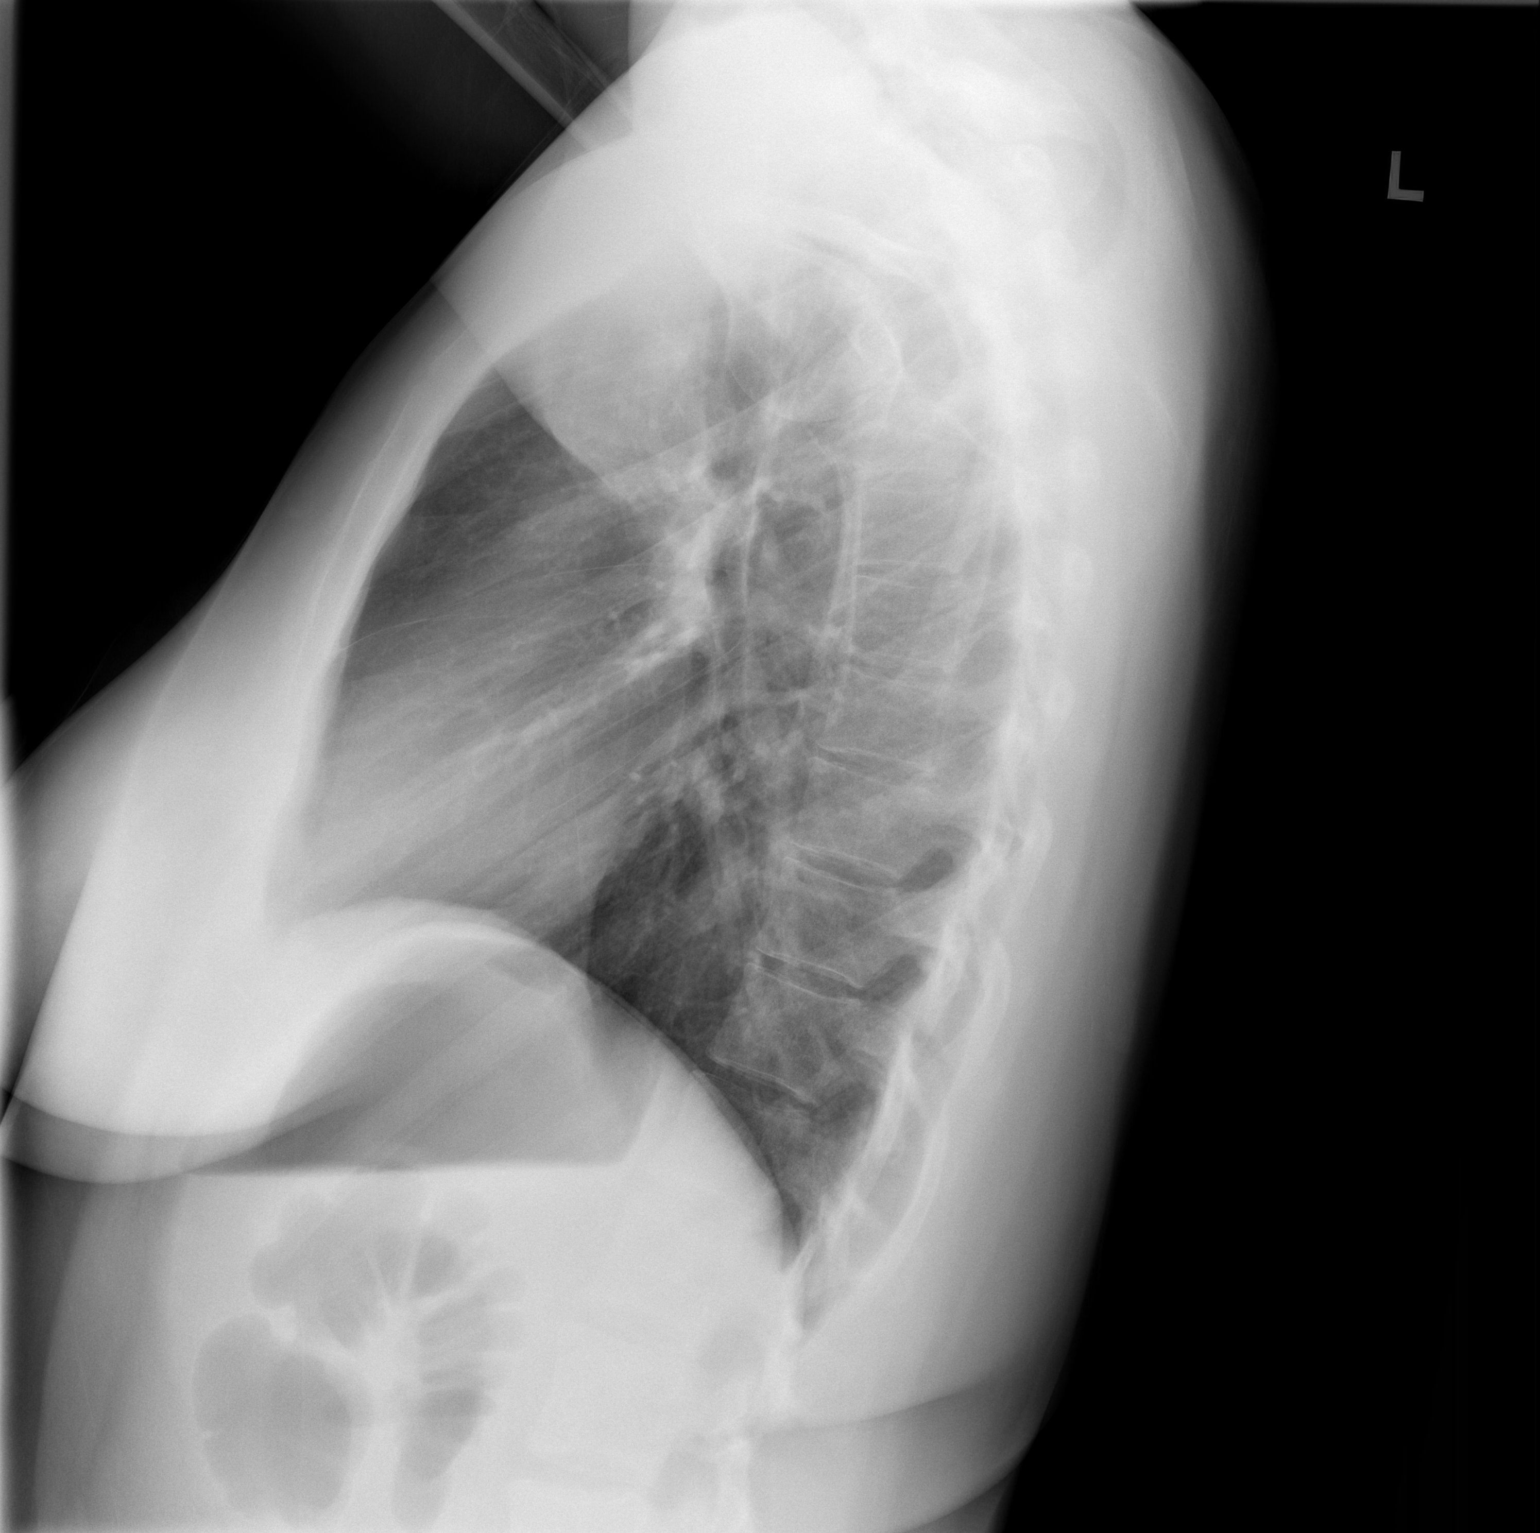

[2 of 2 positions shown; findings below may reference images not displayed]

FINDINGS: Lung volumes are normal. No consolidative airspace disease. No
pleural effusions. No pneumothorax. No pulmonary nodule or mass
noted. Pulmonary vasculature and the cardiomediastinal silhouette
are within normal limits.
IMPRESSION: 1. No radiographic evidence to suggest active intrathoracic
tuberculosis.

## 2021-11-06 ENCOUNTER — Encounter

## 2021-11-06 ENCOUNTER — Ambulatory Visit
Admit: 2021-11-06 | Discharge: 2021-11-06 | Payer: PRIVATE HEALTH INSURANCE | Attending: Family Medicine | Primary: Family Medicine

## 2021-11-06 ENCOUNTER — Encounter: Payer: PRIVATE HEALTH INSURANCE | Primary: Family Medicine

## 2021-11-06 DIAGNOSIS — J029 Acute pharyngitis, unspecified: Secondary | ICD-10-CM

## 2021-11-06 MED ORDER — AMOXICILLIN 500 MG PO CAPS
500 MG | ORAL_CAPSULE | Freq: Two times a day (BID) | ORAL | 0 refills | Status: DC
Start: 2021-11-06 — End: 2021-11-12

## 2021-11-06 MED ORDER — LIDOCAINE VISCOUS HCL 2 % MT SOLN
2 | OROMUCOSAL | 0 refills | 30.00000 days | Status: DC | PRN
Start: 2021-11-06 — End: 2021-11-12

## 2021-11-06 MED ORDER — LIDOCAINE VISCOUS HCL 2 % MT SOLN
2 | OROMUCOSAL | 0 refills | 30.00000 days | Status: DC | PRN
Start: 2021-11-06 — End: 2021-11-06

## 2021-11-06 NOTE — Telephone Encounter (Signed)
From: June Leap  To: Dr. Ferd Glassing  Sent: 11/06/2021 11:39 AM EDT  Subject: Lidocaine out of stock     [facility] on [address] and they have some. Can you switch the prescription over there? I [facility]. Thanks!

## 2021-11-06 NOTE — Telephone Encounter (Signed)
Pended script has updated pharmacy

## 2021-11-06 NOTE — Progress Notes (Signed)
Subjective:       Erica Gibbs is a 26 y.o. female who presents for evaluation of sore throat. Associated symptoms include low grade fevers, myalgias, sore throat, swollen glands, and white spots in throat. Onset of symptoms was 1 week ago, gradually worsening since that time.  She is drinking plenty of fluids. She has not had recent close exposure to someone with proven streptococcal pharyngitis.  Patient's medications, allergies, past medical, surgical, social and family histories were reviewed and updated as appropriate.    Review of Systems  Pertinent items are noted in HPI.      Objective:      BP 137/82   Pulse 85   Temp 98 F (36.7 C) (Temporal)   Ht 5\' 7"  (1.702 m)   Wt 163 lb (73.9 kg)   BMI 25.53 kg/m   Physical Exam  Vitals reviewed.   Constitutional:       Appearance: Normal appearance.   HENT:      Head: Normocephalic and atraumatic.      Right Ear: Tympanic membrane, ear canal and external ear normal.      Left Ear: Tympanic membrane, ear canal and external ear normal.      Nose: Nose normal.      Mouth/Throat:      Mouth: Mucous membranes are moist.      Pharynx: Oropharynx is clear. Posterior oropharyngeal erythema present. No oropharyngeal exudate.      Tonsils: Tonsillar exudate present. No tonsillar abscesses. 4+ on the right. 4+ on the left.   Eyes:      Extraocular Movements: Extraocular movements intact.      Conjunctiva/sclera: Conjunctivae normal.   Cardiovascular:      Rate and Rhythm: Normal rate and regular rhythm.      Pulses: Normal pulses.      Heart sounds: Normal heart sounds.   Pulmonary:      Effort: Pulmonary effort is normal.      Breath sounds: Normal breath sounds.   Abdominal:      Palpations: Abdomen is soft.   Musculoskeletal:         General: No deformity.      Cervical back: Neck supple.   Skin:     General: Skin is warm and dry.      Capillary Refill: Capillary refill takes less than 2 seconds.   Neurological:      General: No focal deficit present.      Mental  Status: She is alert and oriented to person, place, and time. Mental status is at baseline.   Psychiatric:         Mood and Affect: Mood normal.         Behavior: Behavior normal.         Thought Content: Thought content normal.         Judgment: Judgment normal.         Laboratory  Strep test done  Results:negative      Assessment:      Acute Pharyngitis, likely Strep throat.      Plan:     POC strep negative, but exam and history very suggestive of strep pharyngitis.  Sending swab for culture.  Pt placed on antibiotics.  Use of OTC analgesics recommended as well as salt water gargles also given some viscous lidocaine.  Pt advised of the risk of peritonsillar abscess formation.  Pt advised that he will be infectious for 24 hours after starting antibiotics.  Follow up as  needed.

## 2021-11-10 ENCOUNTER — Encounter: Attending: Family Medicine | Primary: Family Medicine

## 2021-11-12 ENCOUNTER — Ambulatory Visit
Admit: 2021-11-12 | Discharge: 2021-11-12 | Payer: PRIVATE HEALTH INSURANCE | Attending: Family Medicine | Primary: Family Medicine

## 2021-11-12 DIAGNOSIS — Z0000 Encounter for general adult medical examination without abnormal findings: Secondary | ICD-10-CM

## 2021-11-12 MED ORDER — MONTELUKAST SODIUM 10 MG PO TABS
10 MG | ORAL_TABLET | Freq: Every evening | ORAL | 5 refills | Status: AC
Start: 2021-11-12 — End: 2022-03-09

## 2021-11-12 MED ORDER — LEVOCETIRIZINE DIHYDROCHLORIDE 5 MG PO TABS
5 MG | ORAL_TABLET | Freq: Every evening | ORAL | 1 refills | Status: DC
Start: 2021-11-12 — End: 2022-06-28

## 2021-11-12 NOTE — Patient Instructions (Signed)
Patient Education        Well Visit, Ages 18 to 65: Care Instructions  Well visits can help you stay healthy. Your doctor has checked your overall health and may have suggested ways to take good care of yourself. Your doctor also may have recommended tests. You can help prevent illness with healthy eating, good sleep, vaccinations, regular exercise, and other steps.    Get the tests that you and your doctor decide on. Depending on your age and risks, examples might include screening for diabetes; hepatitis C; HIV; and cervical, breast, lung, and colon cancer. Screening helps find diseases before any symptoms appear.   Eat healthy foods. Choose fruits, vegetables, whole grains, lean protein, and low-fat dairy foods. Limit saturated fat and reduce salt.     Limit alcohol. Men should have no more than 2 drinks a day. Women should have no more than 1. For some people, no alcohol is the best choice.   Exercise. Get at least 30 minutes of exercise on most days of the week. Walking can be a good choice.     Reach and stay at your healthy weight. This will lower your risk for many health problems.   Take care of your mental health. Try to stay connected with friends, family, and community, and find ways to manage stress.     If you're feeling depressed or hopeless, talk to someone. A counselor can help. If you don't have a counselor, talk to your doctor.   Talk to your doctor if you think you may have a problem with alcohol or drug use. This includes prescription medicines and illegal drugs.     Avoid tobacco and nicotine: Don't smoke, vape, or chew. If you need help quitting, talk to your doctor.   Practice safer sex. Getting tested, using condoms or dental dams, and limiting sex partners can help prevent STIs.     Use birth control if it's important to you to prevent pregnancy. Talk with your doctor about your choices and what might be best for you.   Prevent problems where you can. Protect your skin from too much sun,  wash your hands, brush your teeth twice a day, and wear a seat belt in the car.   Where can you learn more?  Go to https://www.healthwise.net/patientEd and enter P072 to learn more about "Well Visit, Ages 18 to 65: Care Instructions."  Current as of: March 16, 2021               Content Version: 13.7   2006-2023 [employer].   Care instructions adapted under license by [facility]. If you have questions about a medical condition or this instruction, always ask your healthcare professional. Healthwise, Incorporated disclaims any warranty or liability for your use of this information.

## 2021-11-12 NOTE — Progress Notes (Signed)
11/12/2021    Erica Gibbs (DOB:  Sep 21, 1995) is a 26 y.o. female, here for evaluation of the following medical concerns:    HPI    Well Adult Physical: Patient here for a comprehensive physical exam.The patient reports problems - finishing antibiotics for strep  Do you take any herbs or supplements that were not prescribed by a doctor? no Are you taking calcium supplements? not applicable Are you taking aspirin daily? not applicable    Sexual activity: single partner, contraception - condoms   Diet: Healthy  Exercise: aerobics 4 time(s) per week  Seatbelt use: yes    Review of Systems   Constitutional:  Negative for activity change, appetite change, fatigue and unexpected weight change.   HENT:  Negative for hearing loss.    Eyes:  Negative for visual disturbance.   Respiratory:  Negative for chest tightness, shortness of breath and stridor.    Cardiovascular:  Negative for chest pain and palpitations.   Gastrointestinal:  Negative for abdominal pain, blood in stool, constipation and diarrhea.   Endocrine: Negative for polydipsia, polyphagia and polyuria.   Genitourinary:  Negative for dysuria, genital sores, menstrual problem, pelvic pain, vaginal bleeding, vaginal discharge and vaginal pain.   Musculoskeletal:  Negative for arthralgias and back pain.   Skin:  Negative for rash.   Allergic/Immunologic: Negative for environmental allergies.   Neurological:  Negative for dizziness, syncope and headaches.   Hematological:  Negative for adenopathy.   Psychiatric/Behavioral:  Negative for dysphoric mood. The patient is not nervous/anxious.      Prior to Visit Medications    Medication Sig Taking? Authorizing Provider   clindamycin (CLEOCIN) 300 MG capsule Take 1 capsule by mouth 3 times daily for 10 days Yes Historical Provider, MD   predniSONE (DELTASONE) 20 MG tablet Take 2 tablets by mouth daily Yes Historical Provider, MD   montelukast (SINGULAIR) 10 MG tablet Take 1 tablet by mouth nightly Yes Ferd Glassing, DO    levocetirizine (XYZAL) 5 MG tablet Take 1 tablet by mouth nightly Yes Ferd Glassing, DO   topiramate (TOPAMAX SPRINKLE) 25 MG capsule Take 1 capsule by mouth 2 times daily Yes Historical Provider, MD   Fremanezumab-vfrm (AJOVY) 225 MG/1.5ML SOAJ Inject into the skin Once monthly Yes Historical Provider, MD   ondansetron (ZOFRAN) 4 MG tablet Take 1 tablet by mouth every 8 hours as needed for Nausea or Vomiting prn Yes Historical Provider, MD   Ubrogepant (UBRELVY) 50 MG TABS Take 1 tablet by mouth as needed (migraine) Yes Ferd Glassing, DO        Allergies   Allergen Reactions    Peanut Allergen Powder-Dnfp Hives       Past Medical History:   Diagnosis Date    Migraines        History reviewed. No pertinent surgical history.    Social History     Socioeconomic History    Marital status: Single     Spouse name: Not on file    Number of children: Not on file    Years of education: Not on file    Highest education level: Not on file   Occupational History    Not on file   Tobacco Use    Smoking status: Never    Smokeless tobacco: Never   Vaping Use    Vaping Use: Never used   Substance and Sexual Activity    Alcohol use: Yes     Alcohol/week: 3.0 standard drinks     Types: 3  Glasses of wine per week     Comment: occ    Drug use: Never    Sexual activity: Not Currently   Other Topics Concern    Not on file   Social History Narrative    Not on file     Social Determinants of Health     Financial Resource Strain: Low Risk     Difficulty of Paying Living Expenses: Not hard at all   Food Insecurity: No Food Insecurity    Worried About Programme researcher, broadcasting/film/video in the Last Year: Never true    Ran Out of Food in the Last Year: Never true   Transportation Needs: Unknown    Lack of Transportation (Medical): Not on file    Lack of Transportation (Non-Medical): No   Physical Activity: Not on file   Stress: Not on file   Social Connections: Not on file   Intimate Partner Violence: Not on file   Housing Stability: Unknown    Unable to Pay for  Housing in the Last Year: Not on file    Number of Places Lived in the Last Year: Not on file    Unstable Housing in the Last Year: No        Family History   Problem Relation Age of Onset    Hypertension Mother     Graves Disease Mother     Hypertension Father        Vitals:    11/12/21 1500   BP: 133/78   Pulse: 90   Temp: 97.2 F (36.2 C)   TempSrc: Temporal   Weight: 171 lb 9.6 oz (77.8 kg)   Height: 5\' 7"  (1.702 m)     Estimated body mass index is 26.88 kg/m as calculated from the following:    Height as of this encounter: 5\' 7"  (1.702 m).    Weight as of this encounter: 171 lb 9.6 oz (77.8 kg).    Physical Exam  Vitals reviewed.   Constitutional:       Appearance: Normal appearance. She is normal weight.   HENT:      Head: Normocephalic and atraumatic.      Right Ear: Tympanic membrane, ear canal and external ear normal.      Left Ear: Tympanic membrane, ear canal and external ear normal.      Nose: Nose normal.      Mouth/Throat:      Mouth: Mucous membranes are moist.      Pharynx: Oropharynx is clear.   Eyes:      Extraocular Movements: Extraocular movements intact.      Conjunctiva/sclera: Conjunctivae normal.   Cardiovascular:      Rate and Rhythm: Normal rate and regular rhythm.      Pulses: Normal pulses.      Heart sounds: Normal heart sounds.   Pulmonary:      Effort: Pulmonary effort is normal.      Breath sounds: Normal breath sounds.   Abdominal:      General: Abdomen is flat. Bowel sounds are normal.      Palpations: Abdomen is soft.   Musculoskeletal:         General: Normal range of motion.      Cervical back: Normal range of motion and neck supple.   Skin:     General: Skin is warm and dry.      Capillary Refill: Capillary refill takes less than 2 seconds.      Findings: No rash.  Neurological:      General: No focal deficit present.      Mental Status: She is alert and oriented to person, place, and time. Mental status is at baseline.   Psychiatric:         Mood and Affect: Mood normal.          Behavior: Behavior normal.         Thought Content: Thought content normal.         Judgment: Judgment normal.       Separate Identifiable issues addressed today:      ASSESSMENT/PLAN:  Clydie was seen today for annual exam.    Diagnoses and all orders for this visit:    Routine general medical examination at a health care facility: Vitals reviewed and within normal limits.  BMI nonobese.  Depression screen negative.  Reviewed lifestyle with patient and recommended appropriate modifications.    Mild intermittent asthma without complication: Minimally symptomatic.  Infrequent albuterol use.  Most recent exacerbation is distant.    Screening for cervical cancer: Cervical cancer screening completed at outside gynecology.  Will obtain outside records and update care gaps.      Return in about 1 year (around 11/13/2022).    An  electronic signature was used to authenticate this note.    --Ferd Glassing, DO on 11/12/2021 at 3:13 PM

## 2022-02-11 ENCOUNTER — Ambulatory Visit: Admit: 2022-02-11 | Discharge: 2022-02-11 | Payer: PRIVATE HEALTH INSURANCE

## 2022-03-07 ENCOUNTER — Inpatient Hospital Stay
Admit: 2022-03-07 | Discharge: 2022-03-08 | Disposition: A | Discharge status: Home / Self Care | Payer: PRIVATE HEALTH INSURANCE

## 2022-03-07 DIAGNOSIS — T7801XA Anaphylactic reaction due to peanuts, initial encounter: Secondary | ICD-10-CM

## 2022-03-07 DIAGNOSIS — T7840XA Allergy, unspecified, initial encounter: Secondary | ICD-10-CM

## 2022-03-07 MED ORDER — predniSONE (DELTASONE) 20 MG tablet
20 | ORAL_TABLET | Freq: Every day | ORAL | 0 refills | Status: AC
Start: 2022-03-07 — End: 2022-03-12

## 2022-03-07 MED ORDER — famotidine (PEPCID) 20 MG tablet
20 | ORAL_TABLET | Freq: Two times a day (BID) | ORAL | 0 refills | Status: AC
Start: 2022-03-07 — End: 2022-03-10

## 2022-03-07 MED ORDER — diphenhydrAMINE (BENADRYL) capsule 25 mg
25 | Freq: Once | ORAL | Status: AC
Start: 2022-03-07 — End: 2022-03-07
  Administered 2022-03-07: 25 mg via ORAL

## 2022-03-07 MED ORDER — EPINEPHrine (EPIPEN) 0.3 mg/0.3 mL AtIn injection
0.3 | Freq: Once | INTRAMUSCULAR | 0 refills | 30.00000 days | Status: AC
Start: 2022-03-07 — End: 2022-03-07

## 2022-03-07 MED ORDER — famotidine (PEPCID) tablet 20 mg
20 | Freq: Once | ORAL | Status: AC
Start: 2022-03-07 — End: 2022-03-07
  Administered 2022-03-07: 20 mg via ORAL

## 2022-03-07 MED ORDER — predniSONE (DELTASONE) tablet 60 mg
20 | Freq: Once | ORAL | Status: AC
Start: 2022-03-07 — End: 2022-03-07
  Administered 2022-03-07: 60 mg via ORAL

## 2022-03-07 MED FILL — DIPHENHYDRAMINE 25 MG CAPSULE: 25 25 mg | ORAL | Qty: 1

## 2022-03-07 MED FILL — PREDNISONE 20 MG TABLET: 20 20 MG | ORAL | Qty: 3

## 2022-03-07 MED FILL — FAMOTIDINE 20 MG TABLET: 20 20 MG | ORAL | Qty: 1

## 2022-03-07 NOTE — Unmapped (Signed)
Per MD order, pt to be DC to home.      AVS completed and given to patient.  RN went over DC instructions regarding follow-up care and medications to pick up from pharmacy.  Pt verbalized understanding.    RN assisted pt in gathering personal belongings.       Pt left unit in good condition with all personal belongings.  Pt ambulatory to main entrance via  with boyfriend accompanying them.  Per MD order, pt to be DC to home.

## 2022-03-07 NOTE — Unmapped (Signed)
Continue Xyzal. Take prednisone and pepcid as directed. Follow-up with your doctor. Return to the ER for any worsening symptoms or emergent concerns.

## 2022-03-07 NOTE — Unmapped (Signed)
Pt reports she took 1 25mg  tab of benadryl

## 2022-03-07 NOTE — Unmapped (Signed)
ED Attending Attestation Note    Date of service:  03/07/2022    This patient was seen by the resident physician.  I have seen and examined the patient, agree with the workup, evaluation, management and diagnosis. The care plan has been discussed and I concur.  I have reviewed the ECG and concur with the resident's interpretation.    My assessment reveals a 26 y.o. female presenting after mild allergic reaction to peanuts. Mild itching, no urticaria or airway involvement. Given benadryl, pepcid and steroids and monitored.

## 2022-03-07 NOTE — Unmapped (Signed)
[place] ED Note  Date of Service: 03/07/2022  Reason for Visit: Allergic Reaction      Patient History     HPI:  Mia Brooks is a 26 y.o. female who presents to the Emergency Department with a chief complaint of allergic reaction. Patient accidentally ingested peanuts in a dip prior to arrival. Patient reports known peanut allergy though has never required epinephrine. Patient presents to the ER with nausea and itching to her throat. No difficulty breathing. No vomiting or diarrhea. No rash. Patient took one benadryl tablet prior to arrival.     No past medical history on file.    No past surgical history on file.    No family history on file.    Blayre Papania  has no history on file for tobacco use, alcohol use, and drug use.    Previous Medications    DICLOFENAC (VOLTAREN) 50 MG EC TABLET    Take 1 tablet (50 mg total) by mouth 2 times a day.       Allergies:   Allergies as of 03/07/2022 - Fully Reviewed 03/07/2022   Allergen Reaction Noted    Peanut Hives 08/11/2019       Review of Systems     Review of Systems   Constitutional: Negative.    Respiratory: Negative.  Negative for shortness of breath.    Cardiovascular: Negative.    Gastrointestinal:  Positive for nausea. Negative for diarrhea and vomiting.   Genitourinary: Negative.    Musculoskeletal: Negative.    Skin:  Positive for itching. Negative for rash.   Neurological: Negative.    Psychiatric/Behavioral: Negative.     All other systems reviewed and are negative.        Physical Exam     General: Well-developed well-nourished in no acute distress.  HEENT: Head normocephalic atraumatic.  Pupils equal and round.  External ears and nose normal. Posterior pharynx is clear.  Neck: Full range of motion, supple. No stridor.  Pulmonary: Lungs clear to auscultation bilaterally.  No wheezing, rhonchi, or rales.  Cardiac: Regular rate and rhythm.  No murmurs, rubs, or gallops.  Clear S1, S2.  Abdomen: Soft, nontender.  No rebound, guarding, or organomegaly noted.  No  peritoneal findings.  Musculoskeletal: Moving all extremities appropriately, no obvious weakness noted.    Vascular: Palpable pulses to all extremities.  No pitting edema noted.  Skin: Warm and dry.  Neuro: Alert and oriented.  Cranial nerves II through XII without deficit.    Psych:  Normal affect, Normal judgement, Normal mood.  Normal affect, and behavior.    ED Course and MDM     MEDICAL DECISION MAKING    RECENT VITALS:  BP: 135/78, Temp: 97.8 F (36.6 C), Heart Rate: 80, Resp: 16     RADIOLOGY:  No orders to display       LABS:   Labs Reviewed - No data to display    MEDS:  Medications   predniSONE (DELTASONE) tablet 60 mg (60 mg Oral Given 03/07/22 1849)   famotidine (PEPCID) tablet 20 mg (20 mg Oral Given 03/07/22 1849)   diphenhydrAMINE (BENADRYL) capsule 25 mg (25 mg Oral Given 03/07/22 1849)         PROCEDURES: N/A    CONSULTS:  None    MEDICAL DECISION MAKING / ED COURSE:    The patient was seen and examined by myself and presented to Dr. Alvy Beal,*, who also examined the patient and agrees with assessment and plan.  Breella Vanostrand is a 26 y.o. female who presents to the Emergency Department with a chief complaint of allergic reaction. Patient is well appearing and in no acute distress. Vitals are normal with exception of hypertension and tachycardia.  No objective oral/pharyngeal swelling on exam. Lungs clear. No emesis or diarrhea. No hives. No signs of anaphylaxis.  Patient given prednisone, benadryl, and pepcid. Patient was monitored in the ED. Patient symptomatically improved. Vitals improved.  Will discharge with prednisone and pepcid, to continue home Xyzal. Given prescription for Epi-pen. Follow-up with primary care. Return precautions discussed.      Medical Decision Making  Problems Addressed:  Allergic reaction, initial encounter: complicated acute illness or injury    Risk  OTC drugs.  Prescription drug management.        The patient tolerated their visit well.  They were seen and  evaluated by the attending physician who agreed with the assessment and plan.   The patient and / or the family were informed of the results of any tests, a time was given to answer questions, a plan was proposed and they agreed with plan.     DISCHARGE DIAGNOSIS:  1. Allergic reaction, initial encounter        PATIENT REFERRED TO:  [facility] Emergency Department  [address]  [place] [address]  [phone]    As needed, If symptoms worsen      DISCHARGE MEDICATIONS:  New Prescriptions    EPINEPHRINE (EPIPEN) 0.3 MG/0.3 ML ATIN INJECTION    Inject 0.3 mLs (0.3 mg total) into the muscle once for 1 dose.    FAMOTIDINE (PEPCID) 20 MG TABLET    Take 1 tablet (20 mg total) by mouth 2 times a day for 3 days.    PREDNISONE (DELTASONE) 20 MG TABLET    Take 3 tablets (60 mg total) by mouth daily for 4 days.         Critical Care Time (Attendings)          Kathyrn Sheriff, [place]  03/07/22 [phone]

## 2022-03-07 NOTE — Unmapped (Signed)
Pt is allergic to peanuts accidentally ate some in a dip about 20 min ago, c/o itchiness and nausea currently

## 2022-03-07 NOTE — ED Triage Notes (Signed)
Formatting of this note might be different from the original.  Pt is allergic to peanuts accidentally ate some in a dip about 20 min ago, c/o itchiness and nausea currently  Electronically signed by Trena Platt, RN at 03/07/2022  6:22 PM EST

## 2022-03-07 NOTE — Unmapped (Signed)
Formatting of this note might be different from the original.  ED Attending Attestation Note    Date of service:  03/07/2022    This patient was seen by the resident physician.  I have seen and examined the patient, agree with the workup, evaluation, management and diagnosis. The care plan has been discussed and I concur.  I have reviewed the ECG and concur with the resident's interpretation.    My assessment reveals a 26 y.o. female presenting after mild allergic reaction to peanuts. Mild itching, no urticaria or airway involvement. Given benadryl, pepcid and steroids and monitored.      Electronically signed by Gar Ponto, MD at 03/07/2022  6:53 PM EST

## 2022-03-07 NOTE — ED Provider Notes (Signed)
Formatting of this note is different from the original.   [facility] ED Note  Date of Service: 03/07/2022  Reason for Visit: Allergic Reaction    Patient History     HPI:  Erica Gibbs is a 26 y.o. female who presents to the Emergency Department with a chief complaint of allergic reaction. Patient accidentally ingested peanuts in a dip prior to arrival. Patient reports known peanut allergy though has never required epinephrine. Patient presents to the ER with nausea and itching to her throat. No difficulty breathing. No vomiting or diarrhea. No rash. Patient took one benadryl tablet prior to arrival.     No past medical history on file.    No past surgical history on file.    No family history on file.    Erica Gibbs  has no history on file for tobacco use, alcohol use, and drug use.    Previous Medications    DICLOFENAC (VOLTAREN) 50 MG EC TABLET    Take 1 tablet (50 mg total) by mouth 2 times a day.     Allergies:   Allergies as of 03/07/2022 - Fully Reviewed 03/07/2022   Allergen Reaction Noted    Peanut Hives 08/11/2019     Review of Systems     Review of Systems   Constitutional: Negative.    Respiratory: Negative.  Negative for shortness of breath.    Cardiovascular: Negative.    Gastrointestinal:  Positive for nausea. Negative for diarrhea and vomiting.   Genitourinary: Negative.    Musculoskeletal: Negative.    Skin:  Positive for itching. Negative for rash.   Neurological: Negative.    Psychiatric/Behavioral: Negative.     All other systems reviewed and are negative.    Physical Exam     General: Well-developed well-nourished in no acute distress.  HEENT: Head normocephalic atraumatic.  Pupils equal and round.  External ears and nose normal. Posterior pharynx is clear.  Neck: Full range of motion, supple. No stridor.  Pulmonary: Lungs clear to auscultation bilaterally.  No wheezing, rhonchi, or rales.  Cardiac: Regular rate and rhythm.  No murmurs, rubs, or gallops.  Clear S1, S2.  Abdomen: Soft, nontender.  No  rebound, guarding, or organomegaly noted.  No peritoneal findings.  Musculoskeletal: Moving all extremities appropriately, no obvious weakness noted.    Vascular: Palpable pulses to all extremities.  No pitting edema noted.  Skin: Warm and dry.  Neuro: Alert and oriented.  Cranial nerves II through XII without deficit.    Psych:  Normal affect, Normal judgement, Normal mood.  Normal affect, and behavior.    ED Course and MDM     MEDICAL DECISION MAKING    RECENT VITALS:  BP: 135/78, Temp: 97.8 F (36.6 C), Heart Rate: 80, Resp: 16     RADIOLOGY:  No orders to display     LABS:   Labs Reviewed - No data to display    MEDS:  Medications   predniSONE (DELTASONE) tablet 60 mg (60 mg Oral Given 03/07/22 1849)   famotidine (PEPCID) tablet 20 mg (20 mg Oral Given 03/07/22 1849)   diphenhydrAMINE (BENADRYL) capsule 25 mg (25 mg Oral Given 03/07/22 1849)     PROCEDURES: N/A    CONSULTS:  None    MEDICAL DECISION MAKING / ED COURSE:    The patient was seen and examined by myself and presented to Dr. Alvy Beal,*, who also examined the patient and agrees with assessment and plan.     Erica Gibbs is a  26 y.o. female who presents to the Emergency Department with a chief complaint of allergic reaction. Patient is well appearing and in no acute distress. Vitals are normal with exception of hypertension and tachycardia.  No objective oral/pharyngeal swelling on exam. Lungs clear. No emesis or diarrhea. No hives. No signs of anaphylaxis.  Patient given prednisone, benadryl, and pepcid. Patient was monitored in the ED. Patient symptomatically improved. Vitals improved.  Will discharge with prednisone and pepcid, to continue home Xyzal. Given prescription for Epi-pen. Follow-up with primary care. Return precautions discussed.      Medical Decision Making  Problems Addressed:  Allergic reaction, initial encounter: complicated acute illness or injury    Risk  OTC drugs.  Prescription drug management.    The patient tolerated  their visit well.  They were seen and evaluated by the attending physician who agreed with the assessment and plan.   The patient and / or the family were informed of the results of any tests, a time was given to answer questions, a plan was proposed and they agreed with plan.     DISCHARGE DIAGNOSIS:  1. Allergic reaction, initial encounter      PATIENT REFERRED TO:  [facility] Emergency Department  [address]  [place] [address]  [phone]    As needed, If symptoms worsen    DISCHARGE MEDICATIONS:  New Prescriptions    EPINEPHRINE (EPIPEN) 0.3 MG/0.3 ML ATIN INJECTION    Inject 0.3 mLs (0.3 mg total) into the muscle once for 1 dose.    FAMOTIDINE (PEPCID) 20 MG TABLET    Take 1 tablet (20 mg total) by mouth 2 times a day for 3 days.    PREDNISONE (DELTASONE) 20 MG TABLET    Take 3 tablets (60 mg total) by mouth daily for 4 days.     Critical Care Time (Attendings)       Idamae Lusher, [place]  03/07/22 1953    Electronically signed by Gar Ponto, MD at 03/07/2022  9:20 PM EST

## 2022-03-07 NOTE — Nursing Note (Signed)
Formatting of this note might be different from the original.  Per MD order, pt to be DC to home.      AVS completed and given to patient.  RN went over DC instructions regarding follow-up care and medications to pick up from pharmacy.  Pt verbalized understanding.    RN assisted pt in gathering personal belongings.     Pt left unit in good condition with all personal belongings.  Pt ambulatory to main entrance via  with boyfriend accompanying them.  Per MD order, pt to be DC to home.        Electronically signed by Romie Jumper, RN at 03/07/2022  7:57 PM EST

## 2022-03-07 NOTE — ED Notes (Signed)
Formatting of this note might be different from the original.  Pt reports she took 1 25mg  tab of benadryl   Electronically signed by Trena Platt, RN at 03/07/2022  6:27 PM EST

## 2022-03-09 ENCOUNTER — Ambulatory Visit
Admit: 2022-03-09 | Discharge: 2022-03-09 | Payer: PRIVATE HEALTH INSURANCE | Attending: Family Medicine | Primary: Family Medicine

## 2022-03-09 DIAGNOSIS — T7840XA Allergy, unspecified, initial encounter: Secondary | ICD-10-CM

## 2022-03-09 MED ORDER — PREDNISONE 20 MG PO TABS
20 MG | ORAL_TABLET | Freq: Every day | ORAL | 0 refills | Status: AC
Start: 2022-03-09 — End: 2022-03-14

## 2022-03-09 MED ORDER — ALBUTEROL SULFATE HFA 108 (90 BASE) MCG/ACT IN AERS
10890 (90 Base) MCG/ACT | Freq: Four times a day (QID) | RESPIRATORY_TRACT | 1 refills | Status: DC | PRN
Start: 2022-03-09 — End: 2022-09-06

## 2022-03-09 NOTE — Progress Notes (Signed)
03/09/2022     Erica Gibbs (DOB:  [birth year]) is a 26 y.o. female, here for evaluation of the following medical concerns:    HPI  Allergic reaction  Erica Gibbs presents today for evaluation of an allergic reaction.  Erica Gibbs has a known allergy to peanuts and Erica Gibbs was at a [employer] some enchiladas.  About halfway through eating, Erica Gibbs started to feel hot, Erica Gibbs started develop hives.  Erica Gibbs googled what was in the ingredients and noticed that sometimes there can be peanuts in the sauce.  Erica Gibbs stopped eating and try to make herself throw up, but could not.  Erica Gibbs went home and took a couple Benadryl, but while Erica Gibbs was at home Erica Gibbs noticed her symptoms getting worse.  Erica Gibbs presented to the emergency department where Erica Gibbs was given some additional Benadryl and a high dose of steroids.  Erica Gibbs was observed for 2 hours with no worsening of her symptoms and then Erica Gibbs returned home with a 4-day steroid taper.  Erica Gibbs has had no additional symptoms since discharge.    Review of Systems   Constitutional:  Negative for activity change, chills and fever.   HENT:  Negative for congestion, ear pain, rhinorrhea, sinus pressure, sinus pain and sore throat.    Respiratory:  Negative for cough, chest tightness, shortness of breath and wheezing.    Cardiovascular:  Negative for chest pain.   Neurological:  Negative for dizziness.       Prior to Visit Medications    Medication Sig Taking? Authorizing Provider   predniSONE (DELTASONE) 20 MG tablet Take 3 tablets by mouth daily Yes [provider]   famotidine (PEPCID) 20 MG tablet Take 1 tablet by mouth 2 times daily Yes [provider]   predniSONE (DELTASONE) 20 MG tablet Take 1 tablet by mouth daily for 5 days Yes Doroteo Glassman, DO   albuterol sulfate HFA (VENTOLIN HFA) 108 (90 Base) MCG/ACT inhaler Inhale 2 puffs into the lungs 4 times daily as needed for Wheezing Yes Doroteo Glassman, DO   levocetirizine (XYZAL) 5 MG tablet Take 1 tablet by mouth nightly Yes Doroteo Glassman, DO    topiramate (TOPAMAX SPRINKLE) 25 MG capsule Take 1 capsule by mouth 2 times daily Yes [provider]   Fremanezumab-vfrm (AJOVY) 225 MG/1.5ML SOAJ Inject into the skin Once monthly Yes [provider]   ondansetron (ZOFRAN) 4 MG tablet Take 1 tablet by mouth every 8 hours as needed for Nausea or Vomiting prn Yes [provider]   Ubrogepant (UBRELVY) 50 MG TABS Take 1 tablet by mouth as needed (migraine) Yes Doroteo Glassman, DO        Social History     Tobacco Use    Smoking status: Never    Smokeless tobacco: Never   Substance Use Topics    Alcohol use: Yes     Alcohol/week: 3.0 standard drinks of alcohol     Types: 3 Glasses of wine per week     Comment: occ        Vitals:    03/09/22 1445   BP: 122/72   Pulse: 85   Temp: 97.6 F (36.4 C)   TempSrc: Temporal   Weight: 82 kg (180 lb 12.8 oz)   Height: 1.702 m (5\' 7" )     Estimated body mass index is 28.32 kg/m as calculated from the following:    Height as of this encounter: 1.702 m (5\' 7" ).    Weight as of this encounter: 82 kg (180 lb  12.8 oz).    Physical Exam  Vitals reviewed.   Constitutional:       Appearance: Normal appearance.   HENT:      Head: Normocephalic and atraumatic.      Nose: Nose normal.      Mouth/Throat:      Mouth: Mucous membranes are moist.      Pharynx: Oropharynx is clear.   Eyes:      Extraocular Movements: Extraocular movements intact.      Conjunctiva/sclera: Conjunctivae normal.   Cardiovascular:      Rate and Rhythm: Normal rate and regular rhythm.      Pulses: Normal pulses.      Heart sounds: Normal heart sounds.   Pulmonary:      Effort: Pulmonary effort is normal.      Breath sounds: Normal breath sounds.   Musculoskeletal:         General: No deformity.   Skin:     General: Skin is warm and dry.      Capillary Refill: Capillary refill takes less than 2 seconds.   Neurological:      General: No focal deficit present.      Mental Status: Erica Gibbs is alert. Mental status is at baseline.   Psychiatric:          Mood and Affect: Mood normal.         Behavior: Behavior normal.         Thought Content: Thought content normal.         Judgment: Judgment normal.         ASSESSMENT/PLAN:  1. Allergic reaction, initial encounter  Provided with an EpiPen in the emergency department.  Still has 3 days of steroids remaining.  Encouraged her to complete the entire steroid taper.  Keep her EpiPen on person.  Call if needed.    2. Allergy to peanuts  As above      Return if symptoms worsen or fail to improve.    An electronic signature was used to authenticate this note.    --Ferd Glassing, DO on 03/09/2022 at 3:04 PM

## 2022-05-31 MED ORDER — MONTELUKAST SODIUM 10 MG PO TABS
10 MG | ORAL_TABLET | Freq: Every evening | ORAL | 1 refills | Status: DC
Start: 2022-05-31 — End: 2022-11-29

## 2022-05-31 NOTE — Telephone Encounter (Signed)
Medication:   Requested Prescriptions     Pending Prescriptions Disp Refills    montelukast (SINGULAIR) 10 MG tablet [Pharmacy Med Name: MONTELUKAST SOD 10 MG TABLET] 90 tablet 1     Sig: TAKE 1 TABLET BY MOUTH EVERY DAY AT NIGHT        Last Filled:  11/12/21    Patient Phone Number: [phone] (home)     Last appt: 03/09/2022 Return if symptoms worsen or fail to improve.  Next appt: Visit date not found    Last OARRS:        No data to display

## 2022-05-31 NOTE — Telephone Encounter (Signed)
Medication:   Requested Prescriptions     Pending Prescriptions Disp Refills    montelukast (SINGULAIR) 10 MG tablet [Pharmacy Med Name: MONTELUKAST SOD 10 MG TABLET] 90 tablet 1     Sig: TAKE 1 TABLET BY MOUTH EVERY DAY AT NIGHT        Last Filled:      Patient Phone Number: [phone] (home)     Last appt: 03/09/2022   Next appt: Visit date not found    Last OARRS:        No data to display

## 2022-06-28 MED ORDER — LEVOCETIRIZINE DIHYDROCHLORIDE 5 MG PO TABS
5 MG | ORAL_TABLET | Freq: Every evening | ORAL | 1 refills | Status: AC
Start: 2022-06-28 — End: 2022-11-18

## 2022-06-28 NOTE — Telephone Encounter (Signed)
Medication:   Requested Prescriptions     Pending Prescriptions Disp Refills    levocetirizine (XYZAL) 5 MG tablet [Pharmacy Med Name: LEVOCETIRIZINE 5 MG TABLET] 90 tablet 1     Sig: TAKE 1 TABLET BY MOUTH EVERY DAY AT NIGHT        Last Filled:    11/12/21  Patient Phone Number: [phone] (home)     Last appt: 03/09/2022  Return if symptoms worsen or fail to improve.  Next appt: Visit date not found    Last OARRS:        No data to display

## 2022-08-16 NOTE — Progress Notes (Signed)
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  Writer sent student the TB flowsheet via [employer] and informed student via email this was being sent.  We will advise next steps once we review the TB flowsheet

## 2022-09-06 MED ORDER — ALBUTEROL SULFATE HFA 108 (90 BASE) MCG/ACT IN AERS
108 | Freq: Four times a day (QID) | RESPIRATORY_TRACT | 1 refills | Status: DC | PRN
Start: 2022-09-06 — End: 2024-04-11

## 2022-09-06 NOTE — Telephone Encounter (Signed)
Medication:   Requested Prescriptions     Pending Prescriptions Disp Refills    albuterol sulfate HFA (PROVENTIL;VENTOLIN;PROAIR) 108 (90 Base) MCG/ACT inhaler [Pharmacy Med Name: ALBUTEROL HFA (VENTOLIN) INH] 54 each 1     Sig: INHALE 2 PUFFS INTO THE LUNGS 4 TIMES DAILY AS NEEDED FOR WHEEZING.        Last Filled:  03/09/22    Patient Phone Number: [phone] (home)     Last appt: 03/09/2022 Return in about 1 year (around 11/13/2022).   Next appt: Visit date not found    Last OARRS:        No data to display

## 2022-11-17 NOTE — Telephone Encounter (Signed)
Medication:   Requested Prescriptions     Pending Prescriptions Disp Refills    levocetirizine (XYZAL) 5 MG tablet [Pharmacy Med Name: LEVOCETIRIZINE 5 MG TABLET] 90 tablet 1     Sig: TAKE 1 TABLET BY MOUTH EVERY DAY AT NIGHT        Last Filled:  06/28/22    Patient Phone Number: [phone] (home)     Last appt: 03/09/2022   Next appt: 11/29/2022    Last OARRS:        No data to display

## 2022-11-18 MED ORDER — LEVOCETIRIZINE DIHYDROCHLORIDE 5 MG PO TABS
5 MG | ORAL_TABLET | Freq: Every evening | ORAL | 1 refills | Status: AC
Start: 2022-11-18 — End: 2022-11-29

## 2022-11-22 ENCOUNTER — Encounter: Payer: MEDICAID | Attending: Family Medicine | Primary: Family Medicine

## 2022-11-29 ENCOUNTER — Ambulatory Visit: Admit: 2022-11-29 | Discharge: 2022-12-23 | Payer: MEDICAID | Attending: Family Medicine | Primary: Family Medicine

## 2022-11-29 DIAGNOSIS — Z0000 Encounter for general adult medical examination without abnormal findings: Secondary | ICD-10-CM

## 2022-11-29 MED ORDER — LEVOCETIRIZINE DIHYDROCHLORIDE 5 MG PO TABS
5 | ORAL_TABLET | Freq: Every evening | ORAL | 1 refills | Status: DC
Start: 2022-11-29 — End: 2023-12-27

## 2022-11-29 MED ORDER — MONTELUKAST SODIUM 10 MG PO TABS
10 | ORAL_TABLET | Freq: Every evening | ORAL | 1 refills | 90.00 days | Status: DC
Start: 2022-11-29 — End: 2023-09-20

## 2022-11-29 NOTE — Progress Notes (Signed)
11/29/2022    Erica Gibbs (DOB:  Jan 26, 1996) is a 27 y.o. female, here for evaluation of the following medical concerns:    HPI    Well Adult Physical: Patient here for a comprehensive physical exam.The patient reports no problems.  Do you take any herbs or supplements that were not prescribed by a doctor? no Are you taking calcium supplements? not applicable Are you taking aspirin daily? not applicable    Sexual activity: single partner, contraception - condoms   Diet: Healthy  Exercise: aerobics 2-4 time(s) per week  Seatbelt use: yes    Review of Systems   Constitutional:  Negative for activity change, appetite change, fatigue and unexpected weight change.   HENT:  Negative for hearing loss.    Eyes:  Negative for visual disturbance.   Respiratory:  Negative for chest tightness, shortness of breath and stridor.    Cardiovascular:  Negative for chest pain and palpitations.   Gastrointestinal:  Negative for abdominal pain, blood in stool, constipation and diarrhea.   Endocrine: Negative for polydipsia, polyphagia and polyuria.   Genitourinary:  Negative for dysuria, genital sores, menstrual problem, pelvic pain, vaginal bleeding, vaginal discharge and vaginal pain.   Musculoskeletal:  Negative for arthralgias and back pain.   Skin:  Negative for rash.   Allergic/Immunologic: Negative for environmental allergies.   Neurological:  Negative for dizziness, syncope and headaches.   Hematological:  Negative for adenopathy.   Psychiatric/Behavioral:  Negative for dysphoric mood. The patient is not nervous/anxious.        Prior to Visit Medications    Medication Sig Taking? Authorizing Provider   NURTEC 75 MG TBDP Take 75 mg by mouth daily Yes [provider]   FOLIC ACID PO Take by mouth Yes [provider]   topiramate (TOPAMAX) 50 MG tablet Take 1 tablet by mouth daily Yes [provider]   montelukast (SINGULAIR) 10 MG tablet Take 1 tablet by mouth nightly Yes Ferd Glassing, DO    levocetirizine (XYZAL) 5 MG tablet Take 1 tablet by mouth nightly Yes Ferd Glassing, DO   albuterol sulfate HFA (PROVENTIL;VENTOLIN;PROAIR) 108 (90 Base) MCG/ACT inhaler INHALE 2 PUFFS INTO THE LUNGS 4 TIMES DAILY AS NEEDED FOR WHEEZING. Yes Ferd Glassing, DO   Fremanezumab-vfrm (AJOVY) 225 MG/1.5ML SOAJ Inject into the skin Once monthly Yes [provider]   ondansetron (ZOFRAN) 4 MG tablet Take 1 tablet by mouth every 8 hours as needed for Nausea or Vomiting prn Yes [provider]        Allergies   Allergen Reactions    Peanut Allergen Powder-Dnfp Hives       Past Medical History:   Diagnosis Date    Migraines        History reviewed. No pertinent surgical history.    Social History     Socioeconomic History    Marital status: Single     Spouse name: Not on file    Number of children: Not on file    Years of education: Not on file    Highest education level: Not on file   Occupational History    Not on file   Tobacco Use    Smoking status: Never    Smokeless tobacco: Never   Vaping Use    Vaping Use: Never used   Substance and Sexual Activity    Alcohol use: Yes     Alcohol/week: 3.0 standard drinks of alcohol     Types: 3 Glasses of wine per week  Comment: occ    Drug use: Never    Sexual activity: Yes     Partners: Male   Other Topics Concern    Not on file   Social History Narrative    Not on file     Social Determinants of Health     Financial Resource Strain: Low Risk  (11/29/2022)    Overall Financial Resource Strain (CARDIA)     Difficulty of Paying Living Expenses: Not hard at all   Food Insecurity: No Food Insecurity (11/29/2022)    Hunger Vital Sign     Worried About Running Out of Food in the Last Year: Never true     Ran Out of Food in the Last Year: Never true   Transportation Needs: Unknown (11/29/2022)    PRAPARE - Therapist, art (Medical): Not on file     Lack of Transportation (Non-Medical): No   Physical Activity: Not on file   Stress: Not on file    Social Connections: Not on file   Intimate Partner Violence: Not on file   Housing Stability: Unknown (11/29/2022)    Housing Stability Vital Sign     Unable to Pay for Housing in the Last Year: Not on file     Number of Places Lived in the Last Year: Not on file     Unstable Housing in the Last Year: No        Family History   Problem Relation Age of Onset    Hypertension Mother     Graves Disease Mother     High Blood Pressure Mother     Other Mother         Graves disease    Hypertension Father     High Blood Pressure Father     Breast Cancer Maternal Grandmother        Vitals:    11/29/22 1638   BP: 125/72   Pulse: 79   Temp: 98.2 F (36.8 C)   TempSrc: Temporal   SpO2: 98%   Weight: 80.7 kg (178 lb)   Height: 1.702 m (5\' 7" )     Estimated body mass index is 27.88 kg/m as calculated from the following:    Height as of this encounter: 1.702 m (5\' 7" ).    Weight as of this encounter: 80.7 kg (178 lb).    Physical Exam  Vitals reviewed.   Constitutional:       Appearance: Normal appearance. She is normal weight.   HENT:      Head: Normocephalic and atraumatic.      Right Ear: Tympanic membrane, ear canal and external ear normal.      Left Ear: Tympanic membrane, ear canal and external ear normal.      Nose: Nose normal.      Mouth/Throat:      Mouth: Mucous membranes are moist.      Pharynx: Oropharynx is clear.   Eyes:      Extraocular Movements: Extraocular movements intact.      Conjunctiva/sclera: Conjunctivae normal.   Cardiovascular:      Rate and Rhythm: Normal rate and regular rhythm.      Pulses: Normal pulses.      Heart sounds: Normal heart sounds.   Pulmonary:      Effort: Pulmonary effort is normal.      Breath sounds: Normal breath sounds.   Abdominal:      General: Abdomen is flat. Bowel sounds are normal.  Palpations: Abdomen is soft.   Musculoskeletal:         General: Normal range of motion.      Cervical back: Normal range of motion and neck supple.   Skin:     General: Skin is warm and  dry.      Capillary Refill: Capillary refill takes less than 2 seconds.      Findings: No rash.   Neurological:      General: No focal deficit present.      Mental Status: She is alert and oriented to person, place, and time. Mental status is at baseline.   Psychiatric:         Mood and Affect: Mood normal.         Behavior: Behavior normal.         Thought Content: Thought content normal.         Judgment: Judgment normal.         Separate Identifiable issues addressed today:      ASSESSMENT/PLAN:  Cade was seen today for annual exam.    Diagnoses and all orders for this visit:    Routine general medical examination at a health care facility: Vitals reviewed and within normal limits.  BMI nonobese.  Depression screen negative.  Reviewed lifestyle with patient and recommended appropriate modifications.    Screening for cervical cancer: Cervical cancer screening completed at outside gynecology.  Will obtain outside records and update care gaps.    Return in about 1 year (around 11/29/2023) for [employer].    An  electronic signature was used to authenticate this note.    --Ferd Glassing, DO on 11/29/2022 at 5:07 PM

## 2022-11-29 NOTE — Patient Instructions (Signed)
Patient Education        Well Visit, Ages 18 to 65: Care Instructions  Well visits can help you stay healthy. Your doctor has checked your overall health and may have suggested ways to take good care of yourself. Your doctor also may have recommended tests. You can help prevent illness with healthy eating, good sleep, vaccinations, regular exercise, and other steps.    Get the tests that you and your doctor decide on. Depending on your age and risks, examples might include screening for diabetes; hepatitis C; HIV; and cervical, breast, lung, and colon cancer. Screening helps find diseases before any symptoms appear.   Eat healthy foods. Choose fruits, vegetables, whole grains, lean protein, and low-fat dairy foods. Limit saturated fat and reduce salt.     Limit alcohol. Men should have no more than 2 drinks a day. Women should have no more than 1. For some people, no alcohol is the best choice.   Exercise. Get at least 30 minutes of exercise on most days of the week. Walking can be a good choice.     Reach and stay at your healthy weight. This will lower your risk for many health problems.   Take care of your mental health. Try to stay connected with friends, family, and community, and find ways to manage stress.     If you're feeling depressed or hopeless, talk to someone. A counselor can help. If you don't have a counselor, talk to your doctor.   Talk to your doctor if you think you may have a problem with alcohol or drug use. This includes prescription medicines, marijuana, and other drugs.     Avoid tobacco and nicotine: Don't smoke, vape, or chew. If you need help quitting, talk to your doctor.   Practice safer sex. Getting tested, using condoms or dental dams, and limiting sex partners can help prevent STIs.     Use birth control if it's important to you to prevent pregnancy. Talk with your doctor about your choices and what might be best for you.   Prevent problems where you can. Protect your skin from too  much sun, wash your hands, brush your teeth twice a day, and wear a seat belt in the car.   Where can you learn more?  Go to https://www.healthwise.net/patientEd and enter P072 to learn more about "Well Visit, Ages 18 to 65: Care Instructions."  Current as of: December 06, 2021  Content Version: 14.1   2006-2024 [employer].   Care instructions adapted under license by [facility]. If you have questions about a medical condition or this instruction, always ask your healthcare professional. Healthwise, Incorporated disclaims any warranty or liability for your use of this information.

## 2023-01-17 NOTE — Telephone Encounter (Signed)
 levocetirizine (XYZAL ) 5 MG tablet [[id number]]    Order Details  Dose: 5 mg Route: Oral Frequency: NIGHTLY   Dispense Quantity: 90 tablet Refills: 1          Sig: Take 1 tablet by mouth nightly         Start Date: 11/29/22 End Date: --   Written Date: 11/29/22 Expiration Date: 11/29/23   Original Order: levocetirizine (XYZAL ) 5 MG tablet [[id number]]   Providers    Authorizing Provider: Amos Leisure, DO  NPI: [id number]   Ordering User: Amos Leisure, DO          Pharmacy    [facility] [phone] - [place], [place] - [address] [place] [phone] - F [phone]  [address] [place] [address]  Phone: [phone]  Fax: [phone]

## 2023-01-17 NOTE — Telephone Encounter (Signed)
 What is the Dx/ICD-10 code for this medication?    Please respond to the pool ( P MHCX PSC MEDICATION PRE-AUTH).      Thank you!

## 2023-01-18 NOTE — Other (Unsigned)
 Pt [facility] to the [facility] for a foot injury that happened four days ago. Pt states  that she fell while walking in heels. Pt used OTC med's and a brace to help   with  the pain.

## 2023-01-18 NOTE — HIE Documentation (Unsigned)
 Subjective  Subjective:    Patient ID: Erica Gibbs  Age: 27 y.o. (DOB: December 07, 1995)  Ethnicity: Non-Hispanic  Race: White/Caucasian  Gender: female    Chief Complaint:  Chief Complaint  Patient presents with   Foot Pain    HPI inversion injury 3 days ago, persistent discomfort proximal forefoot and  anterolateral left ankle no knee discomfort.  No prior injury to left foot   ankle  or knee.  Also describes abrasion right knee area.  She has been applying  antibiotic ointment, bacitracin here.  Past Medical History:  Diagnosis Date   Migraines    No past surgical history on file.  Family History  Problem Relation Name Age of Onset   Colon Cancer Father   Breast Cancer Maternal Grandmother    Current Outpatient Medications  Medication Sig Dispense Refill   Ajovy  Autoinjector 225 mg/1.5 mL Auto-Injector   FOLIC ACID PO Take by mouth.   Levocetirizine (Xyzal ) 5 mg Tablet Take 1 Tablet by mouth nightly.   montelukast  (SINGULAIR ) 10 mg Tablet Take 10 mg by mouth nightly.   ondansetron (ZOFRAN) 4 mg tablet Take 4 mg by mouth.   topiramate (TOPAMAX) 50 mg Tablet TAKE 1 TABLET BY ORAL ROUTE DAILY   Ubrelvy  50 mg Tablet TAKE 1 TABLET BY MOUTH AS NEEDED (MIGRAINE)    No current facility-administered medications for this visit.    Allergies  Allergen Reactions   Nut - Unspecified Hives    peanuts    Social History    Socioeconomic History   Marital status: Single    Spouse name: Not on file   Number of children: Not on file   Years of education: Not on file   Highest education level: Not on file  Occupational History   Occupation: Med student   Occupation: med consulting civil engineer  Tobacco Use   Smoking status: Never   Smokeless tobacco: Never  Vaping Use   Vaping status: Never Used  Substance and Sexual Activity   Alcohol use: Yes    Alcohol/week: 3.0 standard drinks of alcohol    Types: 2 Glasses of wine, 1 Drinks containing 0.5 oz of alcohol per week   Drug use: Never   Sexual activity: Yes    Partners: Male    Birth control/protection:  I.U.D.  Other Topics Concern   Not on file  Social History Narrative   Not on file    Social Determinants of Health    Financial Resource Strain: Low Risk  (11/29/2022)   Received [facility]   Overall Financial Resource Strain (CARDIA)    Difficulty of Paying Living Expenses: Not hard at all  Food Insecurity: No Food Insecurity (11/29/2022)   Received from [facility]   Hunger Vital Sign    Worried About Running Out of Food in the Last Year: Never true    Ran Out of Food in the Last Year: Never true  Transportation Needs: Audrionna Lampton (11/29/2022)   Received from [facility]   PRAPARE - Administrator, Civil Service (Medical): Not on file    Lack of Transportation (Non-Medical): No  Physical Activity: Not on file  Stress: Not on file  Social Connections: Theoden Mauch (09/15/2021)   Received from [facility], [facility]   Social Network    Social Network: Not on file  Intimate Partner Violence: [place] Urieta (08/07/2021)   Received from [facility], [facility]   HITS    Physically Hurt: Not  on file    Insult or Talk Down To: Not on file    Threaten Physical Harm: Not on file    Scream or Curse: Not on file  Housing Stability: Goebel Hellums (11/06/2021)   Received from [facility]   Housing Stability Vital Sign    Unable to Pay for Housing in the Last Year: Not on file    Number of Places Lived in the Last Year: Not on file    In the last 12 months, was there a time when you did not have a steady place  to sleep or slept in a shelter (including now)?: No      Review of Systems  All other systems reviewed and are negative.        Objective    Objective:  Physical Exam  Vitals and nursing note reviewed.  Constitutional:     General: She is not in acute distress.     Appearance: She is well-developed.  HENT:     Head: Normocephalic.  Cardiovascular:     Rate and Rhythm: Normal rate.  Pulmonary:     Effort: Pulmonary effort is normal. No  respiratory distress.  Musculoskeletal:     General: Swelling and tenderness present. No deformity.     Cervical back: Neck supple.     Comments: Anterolateral mid foot ankle area swollen on the left, patient   able  to ambulate without too much difficulty, no gross instability at the level of  the foot ankle or left knee negative proximal fibular tenderness  Skin:     General: Skin is warm.     Findings: No rash.     Comments: Abrasion right knee area no evidence of cellulitis or foreign  matter  Neurological:     Mental Status: She is alert and oriented to person, place, and time.     Coordination: Coordination normal.  Psychiatric:     Behavior: Behavior normal.      Estimated body mass index is 27.06 kg/m? as calculated from the following:    Height as of 11/08/21: 5' 8 (1.727 m).    Weight as of this encounter: 178 lb (80.7 kg).          Assessment:      ICD-10-CM  1. Sprain and strain of left ankle  S93.402A DIAG-ANKLE MIN 3-VIEWS LT   D03.087J DIAG-FOOT MIN 3-VIEWS LT        Plan:    Right knee area abrasion scrubbed with antiseptic detergent solution then   triple  antibiotic ointment applied with border dressing.  Subsequent care was   discussed  with patient length.  Medical honey recommended.    DIAG-ANKLE MIN 3-VIEWS LT    Gaelen Brager Date: 01/18/2023  DATE: 01/18/2023    EXAM: DIAG-ANKLE MIN 3-VIEWS LT    INDICATION: Pain, injury,    COMPARISON: None    FINDINGS:    No acute bone abnormality. Soft tissues are unremarkable.        IMPRESSION:    1. No acute abnormality.    Electronically signed by Elouise Hugh, MD      DIAG-FOOT MIN 3-VIEWS LT    Eular Panek Date: 01/18/2023  DATE: 01/18/2023    EXAM: DIAG-FOOT MIN 3-VIEWS LT    INDICATION: Pain, strain,    COMPARISON: None    FINDINGS:    No acute bone abnormality. Slight hallux valgus. Soft tissues are   unremarkable.        IMPRESSION:  1. No acute abnormality.    Electronically signed by Elouise Hugh, MD      X-ray results are as noted above, discussed these  findings with patient she   was  reassured.  I did however reminds her that significant soft tissue injury is  obviously done with this injury and she needs to allow improvement by avoiding  painful weight-bearing positions or activity    Multiple other verbal as well as preprinted instructions, recommendations and  warnings provided.  She will follow-up with foot and ankle specialist if not  improving substantially over the next 7-10 days.      This report was generated utilizing voice recognition software.  Voice  recognition errors may be present and cannot be totally eliminated.  If errors  are noted or questions or concerns arise, please contact the Urgent Care   Center  at [phone]. Multiple other verbal as well as preprinted instructions,  recommendations and warnings provided.

## 2023-01-21 NOTE — Telephone Encounter (Signed)
 Submitted PA for Levocetirizine Dihydrochloride  5MG  tablets  Via CMM Key: [id number]  STATUS: PENDING.    Follow up done daily; if no decision with in three days we will refax.  If another three days goes by with no decision will call the insurance for status.

## 2023-02-03 NOTE — Telephone Encounter (Signed)
 DENIAL for Levocetirizine Dihydrochloride  5MG  tablets. Coverage is provided when the member has a history of at least 30 days of therapy with at least TWO different preferred medications, which include but are not limited to: cetirizine syrup and tablets, loratadine syrup and tablets.    If this requires a response please respond to the pool ( P MHCX PSC MEDICATION PRE-AUTH).      Thank you please advise patient.

## 2023-02-04 ENCOUNTER — Ambulatory Visit: Admit: 2023-02-04 | Discharge: 2023-02-04 | Payer: PRIVATE HEALTH INSURANCE

## 2023-03-28 MED ORDER — AJOVY 225 MG/1.5ML SC SOAJ
225 | SUBCUTANEOUS | 2 refills | Status: AC
Start: 2023-03-28 — End: ?

## 2023-04-10 ENCOUNTER — Encounter: Admit: 2023-04-10 | Admitting: Family Medicine

## 2023-04-10 DIAGNOSIS — Z111 Encounter for screening for respiratory tuberculosis: Secondary | ICD-10-CM

## 2023-04-11 ENCOUNTER — Encounter: Admit: 2023-04-11 | Admitting: Family Medicine

## 2023-04-11 ENCOUNTER — Inpatient Hospital Stay
Disposition: A | Discharge status: Home / Self Care | Payer: Medicaid (Managed Care) | Source: Ambulatory Visit | Admitting: Family Medicine | Primary: Family Medicine

## 2023-04-11 ENCOUNTER — Ambulatory Visit
Admit: 2023-04-11 | Discharge: 2023-04-12 | Disposition: A | Discharge status: Home / Self Care | Payer: MEDICAID | Source: Home / Self Care | Primary: Family Medicine

## 2023-04-11 DIAGNOSIS — Z111 Encounter for screening for respiratory tuberculosis: Principal | ICD-10-CM

## 2023-04-11 NOTE — Addendum Note (Signed)
 Addended by: Tobi Bastos on: 04/11/2023 01:24 PM     Modules accepted: Orders

## 2023-04-11 NOTE — Addendum Note (Signed)
 Addended by: Tobi Bastos on: 04/11/2023 07:21 AM     Modules accepted: Orders

## 2023-04-15 LAB — QUANTIFERON (R) TB GOLD
QuantiFERON Nil: 0.01 [IU]/mL
QuantiFERON-TB minus NIL: 8.35 [IU]/mL
Quantiferon Plus 1-Tube TB2 minus NIL: 0.27 [IU]/mL (ref <=0.34)
Quantiferon Plus TB1 minus NIL: 0.29 [IU]/mL (ref <=0.34)
Quantiferon TB Gold Plus: NEGATIVE

## 2023-05-06 ENCOUNTER — Ambulatory Visit: Admit: 2023-05-06 | Payer: PRIVATE HEALTH INSURANCE

## 2023-05-06 DIAGNOSIS — G43109 Migraine with aura, not intractable, without status migrainosus: Secondary | ICD-10-CM

## 2023-05-06 MED ORDER — folic acid (FOLVITE) 1 MG tablet
1 | ORAL_TABLET | Freq: Every day | ORAL | 3 refills | Status: AC
Start: 2023-05-06 — End: ?

## 2023-05-06 MED ORDER — topiramate (TOPAMAX) 50 MG tablet
50 | ORAL_TABLET | Freq: Every day | ORAL | 3 refills | Status: AC
Start: 2023-05-06 — End: ?

## 2023-05-06 MED ORDER — fremanezumab-vfrm 225 mg/1.5 mL AtIn
225 | SUBCUTANEOUS | 12 refills
Start: 2023-05-06 — End: 2023-05-06

## 2023-05-06 MED ORDER — rimegepant 75 mg TbDL
75 | ORAL_TABLET | Freq: Every day | ORAL | 11 refills | Status: AC | PRN
Start: 2023-05-06 — End: ?
  Filled 2023-05-12: qty 8, 30d supply, fill #0

## 2023-05-06 NOTE — Progress Notes (Signed)
Subjective     Patient ID: Mia Brooks is a 28 y.o. female.    HPI     28yo female with history of migraine with aura / mild intermittent asthma here for headache consultation    -self-referred  -from [place]. Went to [place] and majored in sociology. 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 She is interested in med-peds/    Headache #1: migraine  -Onset / progression: headaches started at the age of 33. She recalls having nausea and numbness on L side of her face and was told she had migraine. She does not recall having a headache. She had a similar episode about 1 month later but this time the numbness involved numbness of one of her arms. She would continue to get visual auras with severe nausea and fatigue but interestingly the headaches were minimal. Her headaches were well controlled until college at [employer]. While at [employer] she was prescribed rizatriptan, which helped. She failed nortriptyline. She was then started on fremanezumab, which helped. Topiramate was later added after she had a flare due to covid-19. She has been stable on this regimen ever since. She was previously seeing a neurologist at [facility] but can no longer go there due to her insurance changing to a managed medicaid plan.   -Pain location: R temple / cervical spine  -Pain quality: throbbing  -Pain duration: at least 12 hours  -Pain severity: 7/10 when severe  -Headache frequency: 6/30 days (4 mild-moderate, 2 severe). Prior to fremanezumab and topiramate she was having 10 monthly headache days.  -Neck / shoulder pain: she always gets neck pain with her migraines  -Prodrome / aura: she previously had visual and sensory auras but has not had auras since age of 63  -Migrainous symptoms: no photophobia / yes phonophobia / no osmophobia / yes nausea / rare vomiting  -Autonomic symptoms: no conjunctival injection / no lacrimation / no eyelid swelling / no eyelid droop / no nasal congestion / no rhinorrhea / no perspiration / no ear  fullness  -Pressure-related symptoms: no worse with lying down / no worse with standing upright / no worse with bending down or leaning forward / no worse with coughing, sneezing, laughing or Valsalva / no pulsatile tinnitus  -Vertiginous symptoms: no  -Headache triggers: stress / dehydration / skipping meals / allergies / lack of sleep / menstrual cycle  -Sleep pattern: 5-6 hours of sleep (she is currently in med school)  -Life impact: headaches are not impactful now. Prior to fremanezumab and topiramate she frequently missed work.  -Current treatments: fremanezumab 225 mg/month (helps, no side effects) / topiramate 50mg  qhs (helps, causes mild paresthesias)  -Past treatments: nortriptyline (didn't help) / naratriptan (caused dizziness) / rizatriptan (caused dizziness)  -Tests / neuroimaging: MRI in remote past was normal per patient          Histories:     She has a past medical history of Headache (10/02/2007).    She has a past surgical history that includes Wisdom tooth extraction (10/01/2012).    Her family history includes Cancer in her maternal grandmother; Hypertension in her father and mother; Migraines in her maternal aunt and mother; Stroke in her paternal grandfather.    She reports that she has never smoked. She has never used smokeless tobacco. She reports current alcohol use of about 2.0 standard drinks of alcohol per week. She reports that she does not use drugs.      Review of Systems  Constitutional:  Negative for fever, night sweats, weight change, chills and fatigue.   Skin:  Negative for rash.   HENT:  Positive for neck pain and neck stiffness. Negative for hearing loss, ear pain, tinnitus, congestion, rhinorrhea, postnasal drip, sinus pressure, sore throat and trouble swallowing.    Eyes:  Positive for photophobia. Negative for loss of vision, double vision, blurred vision, pain, redness and visual disturbance.   Cardiovascular:  Negative for chest pain, leg swelling and palpitations.    Respiratory:  Negative for shortness of breath, wheezing, chest tightness and cough.    Gastrointestinal:  Positive for vomiting and nausea. Negative for abdominal pain, diarrhea, constipation, blood in stool and heartburn.   Endocrine: Negative for diabetes and history of thyroid disease.   Musculoskeletal:  Negative for musculoskeletal pain, muscle weakness, arthralgias, back pain, gait problem and myalgias.   Psychiatric/Behavioral:  Negative for depression, nervous/anxious, agitation, confusion, dysphoric mood and sleep disturbance.    Neurological:  Positive for headaches. Negative for seizures, syncope, memory loss, dizziness, facial asymmetry, light-headedness, numbness, speech difficulty, tremors and weakness.   All other systems reviewed and are negative.      Allergies:   Peanut    Medications:     Outpatient Encounter Medications as of 05/06/2023   Medication Sig Dispense Refill    diclofenac (VOLTAREN) 50 MG EC tablet Take 1 tablet (50 mg total) by mouth 2 times a day. 60 tablet 0     No facility-administered encounter medications on file as of 05/06/2023.        Prescribed Not Marked Taking Medications         Disp Refills Start End    diclofenac (VOLTAREN) 50 MG EC tablet 60 tablet 0 06/29/2021 --    Sig: Take 1 tablet (50 mg total) by mouth 2 times a day.    Route: Oral    06/29/21 1601 - Original Entry by Estelle Grumbles, MD  Authorizing Provider: Estelle Grumbles, MD               Name:  diclofenac (VOLTAREN) 50 MG EC tablet Start date:  06/29/21 End date:  --    Medication:  DICLOFENAC SODIUM 50 MG TABLET,DELAYED RELEASE [15340]    Dose:  50 mg Route:  Oral Frequency:  2 times daily    Sig: Take 1 tablet (50 mg total) by mouth 2 times a day.    Instructions:  --                Changes to the order are displayed in red.  Note that there may be changes made to the order that are not shown in this report, such as changes to details about ingredients.                            Objective     There were  no vitals taken for this visit.    Neurological Exam  Mental Status  Alert. Oriented to person, place, and time.    Reflexes  Deep tendon reflexes are 2+ and symmetric in all four extremities.                                            Right  Left  Brachioradialis                    2+                         2+  Biceps                                 2+                         2+  Triceps                                2+                         2+  Patellar                                2+                         2+  Achilles                                2+                         2+    Coordination  Tremors    Gait   Normal gait.      Physical Exam  Vitals reviewed.   Constitutional:       General: She is not in acute distress.     Appearance: Normal appearance. She is well-developed.   HENT:      Head: Normocephalic and atraumatic.   Neck:      Thyroid: No thyromegaly.   Cardiovascular:      Rate and Rhythm: Normal rate.   Pulmonary:      Effort: Pulmonary effort is normal.   Musculoskeletal:         General: No tenderness. Normal range of motion.      Cervical back: Normal range of motion and neck supple. No rigidity or tenderness.   Skin:     General: Skin is warm and dry.      Findings: No rash.   Neurological:      General: No focal deficit present.      Mental Status: She is alert and oriented to person, place, and time. She is not disoriented.      GCS: GCS eye subscore is 4. GCS verbal subscore is 5. GCS motor subscore is 6.      Cranial Nerves: No cranial nerve deficit.      Sensory: No sensory deficit.      Motor: No weakness, tremor, atrophy, abnormal muscle tone or seizure activity.      Coordination: Coordination normal.      Gait: Gait normal.      Deep Tendon Reflexes: Reflexes are normal and symmetric. Reflexes normal.      Reflex Scores:       Tricep reflexes are 2+ on the right side and 2+ on the left side.       Bicep reflexes are 2+  on the right side and 2+ on the left  side.       Brachioradialis reflexes are 2+ on the right side and 2+ on the left side.       Patellar reflexes are 2+ on the right side and 2+ on the left side.       Achilles reflexes are 2+ on the right side and 2+ on the left side.  Psychiatric:         Behavior: Behavior normal.         Thought Content: Thought content normal.         Judgment: Judgment normal.         Prior Diagnostic Testing:          Assessment     ASSESSMENT :  Migraine with aura - The patient meets ICHD-3 diagnostic criteria for migraine with aura, which is defined by at least two attacks consisting of one or more of the following fully reversible aura symptoms (visual, sensory, speech and/or language, motor, brainstem, retinal) and at least two of the following characteristics (aura spreads gradually over at least five minutes and/or two or more symptoms occur in succession, aura lasts 5-60 minutes, at least one aura is unilateral, aura is accompanied or followed by headache within 60 minutes).  Mild intermittent asthma  PLAN:  Preventive therapy:  -continue fremanezumab 225 mg/month  -continue topiramate 50mg  daily  -continue folic acid 1mg  daily (may help prevent neural tube defects should she become pregnant)    Abortive therapy:  -continue rimegepant 75mg  ODT daily PRN    Labs / imaging / referrals:  -nothing additional needed at this time    Headache education:  -Discussed pathophysiology of headache.  -Discussed use of a headache diary to track headaches and potential triggers.  -Discussed triggers and lifestyle modifications, including but not limited to the following: maintaining proper sleep hygiene and getting 7-8 hours of sleep per night, maintaining appropriate body weight, exercising for 30 minutes per day most days of the week, staying well hydrated, not skipping meals, quitting smoking (if applicable), and limiting caffeine consumption to no more than two servings per day.   -Instructed patient about proper use of  medications, both preventive and abortive.  -Discussed potential adverse effects and drug interactions of medications.   -Discussed medication overuse headache and to limit use of analgesics to no more than 2-3 days per week or 8-10 days per month.    RTC 1 year or PRN    Total time spent with patient was 45 minutes. Greater than 50% of that time was spent counseling and coordinating the care of the patient.  Please see my assessment and plan for details.

## 2023-05-06 NOTE — Patient Instructions (Signed)
 [facility]  [address]  [place], [place] [address]  Tel: [phone]    Curbside Hours:  Monday-Friday 9AM -4PM

## 2023-05-06 NOTE — Addendum Note (Signed)
Addended by: Nelle Don D on: 05/09/2023 01:03 PM     Modules accepted: Orders

## 2023-05-09 MED ORDER — fremanezumab-vfrm 225 mg/1.5 mL AtIn
225 | SUBCUTANEOUS | 12 refills | Status: AC
Start: 2023-05-09 — End: ?
  Filled 2023-05-12: qty 1.5, 30d supply, fill #0

## 2023-05-09 NOTE — Telephone Encounter (Addendum)
[facility]    Prescription renewal received for: Nurtec    28 YO female has failed Naratriptan and Rizatriptan which both cause dizziness.     Insurance rejection message: PA required    Insurance prior authorization process started via: CMM    Using EPIC chart as reference, provided requested information to insurance including:  diagnosis code and treatment history    Claim status: approved.      Key:  [id number]        Resulted in $0 copay with [facility]      [facility]    Prescription received for: Ajovy but was discontinued by MD.  In speaking with the patient, they should still be taking so requested Rx from MD      Prescription received for: Ajovy Autoinjector    No PA needed, current PA expires 10/11/23    Resulted in $0 copay with [facility] Aileene Lanum Rph, Pharm D  05/09/2023

## 2023-05-11 NOTE — Telephone Encounter (Addendum)
[facility]    Initial Pharmacist Assessment     Mia Brooks is a 28 y.o. old patient who  has a past medical history of Asthma and Headache (10/02/2007)..    Prescription received for: Ajovy / Nurtec    Chart reviewed for diagnosis, medication appropriateness (including dose and duration), and potential interactions.    Reviewed the role of specialty pharmacy.  Welcome packet will be provided with first order which includes bill of rights, privacy notice, pharmacy contact information (including 24- hour on call pharmacist), hours of operations, complaint process, refill procedure, information about patient assistance programs and patient management program (including opt-out option).     Discussed indication, dose, duration of therapy, storage, administration, potential side effects, safe disposal, pregnancy avoidance (if applicable), and teaching/discussion/referral for training for injectable medications.  Patient verbalized understanding.     Medication & Allergy Reconciliation:  Patient's prescription and non-prescription medications and natural supplements were reviewed and updated.     Medication changes: Continue Ajovy / Nurtec PRN    Medication list:   Current Outpatient Medications   Medication Sig Dispense Refill    folic acid (FOLVITE) 1 MG tablet Take 1 tablet (1 mg total) by mouth daily. 90 tablet 3    fremanezumab-vfrm 225 mg/1.5 mL AtIn Inject the contents of 1 pen (1.5 mLs, 225 mg total) subcutaneously every 28 days. 1.5 mL 12    rimegepant 75 mg TbDL Dissolve 1 tablet (75 mg total) in mouth once daily as needed for migraine. 8 tablet 11    topiramate (TOPAMAX) 50 MG tablet Take 1 tablet (50 mg total) by mouth daily. Indications: Migraine Prevention 90 tablet 3     No current facility-administered medications for this visit.       Allergies:   Allergies   Allergen Reactions    Peanut Hives       Assessment of Patient's Therapy and Barriers:  Patient has been taking both Ajovy and  Nurtec and is well controlled currently.  - Baseline Migraine Characteristics: Frequency:  1-3/month; Severity: 7/10 pain scale; Duration:  1-2 hours with Nurtec / 12-24 hours if she is without Nurtec; Abortive therapy use Nurtec with each migraine if she has access.  - Missed days from work/school/planned activities: 0 work / school / 0 planned events.  She stressed that she has an effective preventative and treatment plan in place with Nurtec / Ajovy.  She has experienced a little mild constipation but treatable with Miralax.  - Hospitalization, urgent care visit, or unplanned doctor visit in last 30 days: 0   - In general, patient states QOL is Excellent    Recent Neurology Visits  Visit Information       Date & Time  05/06/2023  4:15 PM Provider  Koleen Nimrod, MD Department  [facility] Encounter #  [id number]            Adherence and Counseling:   - Patient's confidence in ability to administer medication and follow treatment plan as prescribed (0 = least confident, 10 = most confident): 10 - has been using    Adverse Effects and Patient Issues:   - Family planning: not pregnant or expecting to become pregnant     - Birth control method: I.U.D.     - Acute infection status: none    Plan:     - Prescription will be filled by [facility].    Method of delivery:  Curbside pickup by end of day 1/8. Patient should receive 1/8.    - Recommended monitoring: Number of headache days    - Specialty pharmacy staff will contact patient yearly for assessment of adherence, side effects, labs, and patient consent to bill/ship.      Specialty Pharmacy Care Management Plan:     1. Needs assessment: initiation of therapy     2. Strategies to address needs: injection training    3. Goals and time frame: Decrease number of headache days, decrease severity, decrease duration of migraines, and/or use of rescue medications.    4. Resources available to implement care management  plan: not applicable    5. Patient's motivation: high    Treatment based on AHS guidelines.

## 2023-06-01 ENCOUNTER — Ambulatory Visit: Admit: 2023-06-01 | Discharge: 2023-06-01 | Payer: MEDICAID | Attending: Family Medicine | Primary: Family Medicine

## 2023-06-01 VITALS — BP 122/68 | HR 100 | Temp 97.60000°F | Ht 67.0 in | Wt 174.0 lb

## 2023-06-01 DIAGNOSIS — J069 Acute upper respiratory infection, unspecified: Secondary | ICD-10-CM

## 2023-06-01 MED ORDER — AMOXICILLIN 500 MG PO CAPS
500 | ORAL_CAPSULE | Freq: Two times a day (BID) | ORAL | 0 refills | Status: AC
Start: 2023-06-01 — End: 2023-06-11

## 2023-06-01 NOTE — Progress Notes (Signed)
06/01/2023     Erica Gibbs (DOB:  [birth year]) is a 28 y.o. female, here for evaluation of the following medical concerns:    HPI  Upper Respiratory Infection  Patient complains of symptoms of a URI. Symptoms include congestion and sore throat. Onset of symptoms was 3 days ago, gradually improving since that time.  Patient denies any other symptoms.  He is drinking plenty of fluids. Evaluation to date: none. Treatment to date: Acetaminophen, NSAID, which has been somewhat effective.    Review of Systems   Constitutional:  Negative for activity change, chills and fever.   HENT:  Positive for congestion, rhinorrhea, sinus pressure and sore throat. Negative for ear pain and sinus pain.    Respiratory:  Positive for cough. Negative for chest tightness, shortness of breath and wheezing.    Cardiovascular:  Negative for chest pain.   Neurological:  Negative for dizziness.       Prior to Visit Medications    Medication Sig Taking? Authorizing Provider   amoxicillin (AMOXIL) 500 MG capsule Take 1 capsule by mouth 2 times daily for 10 days Yes Ferd Glassing, DO   Fremanezumab-vfrm (AJOVY) 225 MG/1.5ML SOAJ Inject 225 mg into the skin every 30 days Once monthly Yes Ferd Glassing, DO   NURTEC 75 MG TBDP Take 75 mg by mouth daily Yes [provider]   FOLIC ACID PO Take by mouth Yes [provider]   topiramate (TOPAMAX) 50 MG tablet Take 1 tablet by mouth daily Yes [provider]   montelukast (SINGULAIR) 10 MG tablet Take 1 tablet by mouth nightly Yes Ferd Glassing, DO   levocetirizine (XYZAL) 5 MG tablet Take 1 tablet by mouth nightly Yes Ferd Glassing, DO   albuterol sulfate HFA (PROVENTIL;VENTOLIN;PROAIR) 108 (90 Base) MCG/ACT inhaler INHALE 2 PUFFS INTO THE LUNGS 4 TIMES DAILY AS NEEDED FOR WHEEZING. Yes Ferd Glassing, DO   ondansetron (ZOFRAN) 4 MG tablet Take 1 tablet by mouth every 8 hours as needed for Nausea or Vomiting prn Yes [provider]        Social History     Tobacco Use     Smoking status: Never    Smokeless tobacco: Never   Substance Use Topics    Alcohol use: Yes     Alcohol/week: 3.0 standard drinks of alcohol     Types: 3 Glasses of wine per week     Comment: occ        Vitals:    06/01/23 1306   BP: 122/68   Pulse: 100   Temp: 97.6 F (36.4 C)   TempSrc: Temporal   Weight: 78.9 kg (174 lb)   Height: 1.702 m (5\' 7" )     Estimated body mass index is 27.25 kg/m as calculated from the following:    Height as of this encounter: 1.702 m (5\' 7" ).    Weight as of this encounter: 78.9 kg (174 lb).    Physical Exam  Vitals reviewed.   Constitutional:       General: She is not in acute distress.     Appearance: Normal appearance. She is not ill-appearing.   HENT:      Head: Normocephalic and atraumatic.      Right Ear: Tympanic membrane, ear canal and external ear normal.      Left Ear: Tympanic membrane, ear canal and external ear normal.      Nose: Congestion and rhinorrhea present.      Mouth/Throat:  Pharynx: Oropharynx is clear. Posterior oropharyngeal erythema present. No oropharyngeal exudate.   Eyes:      Conjunctiva/sclera: Conjunctivae normal.   Cardiovascular:      Rate and Rhythm: Normal rate and regular rhythm.   Pulmonary:      Effort: Pulmonary effort is normal.   Lymphadenopathy:      Cervical: No cervical adenopathy.   Skin:     General: Skin is warm and dry.      Capillary Refill: Capillary refill takes less than 2 seconds.   Neurological:      Mental Status: She is alert. Mental status is at baseline.   Psychiatric:         Mood and Affect: Mood normal.         Behavior: Behavior normal.         Thought Content: Thought content normal.         Judgment: Judgment normal.       ASSESSMENT/PLAN:  1. Viral upper respiratory tract infection  Explained expected course to patient as well as symptomatic care.  Counseled on reasons to call or return to the office for evaluation.  Follow-up as needed.    Return if symptoms worsen or fail to improve.    An electronic signature  was used to authenticate this note.    --Ferd Glassing, DO on 06/01/2023 at 1:31 PM

## 2023-06-13 MED FILL — FREMANEZUMAB-VFRM 225 MG/1.5 ML SUBCUTANEOUS AUTO-INJECTOR: 225 225 mg/1.5 mL | SUBCUTANEOUS | 30 days supply | Qty: 1.5 | Fill #1

## 2023-06-16 MED ORDER — ondansetron (ZOFRAN-ODT) 4 MG disintegrating tablet
4 | ORAL_TABLET | Freq: Three times a day (TID) | ORAL | 5 refills | Status: AC | PRN
Start: 2023-06-16 — End: ?

## 2023-07-04 MED FILL — FREMANEZUMAB-VFRM 225 MG/1.5 ML SUBCUTANEOUS AUTO-INJECTOR: 225 225 mg/1.5 mL | SUBCUTANEOUS | 30 days supply | Qty: 1.5 | Fill #2

## 2023-07-04 MED FILL — RIMEGEPANT 75 MG DISINTEGRATING TABLET: 75 75 mg | ORAL | 30 days supply | Qty: 8 | Fill #1

## 2023-07-05 ENCOUNTER — Encounter

## 2023-07-07 ENCOUNTER — Other Ambulatory Visit: Admit: 2023-07-07 | Payer: Medicaid (Managed Care)

## 2023-07-07 DIAGNOSIS — Z9229 Personal history of other drug therapy: Secondary | ICD-10-CM

## 2023-07-07 DIAGNOSIS — Z5181 Encounter for therapeutic drug level monitoring: Secondary | ICD-10-CM

## 2023-07-07 LAB — VARICELLA ZOSTER ANTIBODY, IGG
VZV NUM: 11.9 {s_co_ratio} — ABNORMAL HIGH (ref 0.00–0.99)
Varicella IgG: POSITIVE {s_co_ratio} — AB

## 2023-07-07 LAB — MMR(IGG) PANEL (MEASLES, MUMPS, RUBELLA)
MUMPS IGG NUM: 105 U/mL — ABNORMAL HIGH (ref 0.0–8.9)
Mumps IgG: POSITIVE
RUB IGG NUM: 72.8 U/mL — ABNORMAL HIGH (ref 0.00–13.40)
RUB NUM: 5.91 {index} — ABNORMAL HIGH (ref 0.00–0.89)
Rubella IgG Scr: POSITIVE
Rubeola Ab, IgG: POSITIVE

## 2023-08-10 MED FILL — FREMANEZUMAB-VFRM 225 MG/1.5 ML SUBCUTANEOUS AUTO-INJECTOR: 225 225 mg/1.5 mL | SUBCUTANEOUS | 30 days supply | Qty: 1.5 | Fill #3

## 2023-08-15 NOTE — Other (Unsigned)
 Subjective  Subjective:    Patient ID: Erica Gibbs  Age: 28 y.o. (DOB: 23-Oct-1995)  Ethnicity: Non-Hispanic  Race: White/Caucasian  Gender: female    Chief Complaint:  Chief Complaint  Patient presents with   Gynecologic Exam    Annual      28 y.o. G0P0000 who presents for annual exam.  She denies concerns.  Doing   well  with [employer].  Periods are normal.  No new medical problems.  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  Couples matching, maybe in [place].        Past Medical History:  Diagnosis Date   Migraines    OB History  Gravida Para Term Preterm AB Living  0 0 0 0 0 0  SAB IAB Ectopic Multiple Live Births  0 0 0 0 0    No past surgical history on file.  Social History    Tobacco Use   Smoking status: Never   Smokeless tobacco: Never  Vaping Use   Vaping status: Never Used  Substance Use Topics   Alcohol use: Yes    Alcohol/week: 3.0 standard drinks of alcohol    Types: 2 Glasses of wine, 1 Drinks containing 0.5 oz of alcohol per week   Drug use: Never    Social History    Substance and Sexual Activity  Sexual Activity Yes   Partners: Male   Birth control/protection: I.U.D.    Social History    Social History Narrative   Not on file    Family History  Problem Relation Name Age of Onset   Colon Cancer Father   Breast Cancer Maternal Grandmother      Medications:  Current Outpatient Medications  Medication Sig Dispense Refill   Ajovy Autoinjector 225 mg/1.5 mL Auto-Injector   FOLIC ACID PO Take by mouth.   Levocetirizine (Xyzal) 5 mg Tablet Take 1 Tablet by mouth nightly.   montelukast (SINGULAIR) 10 mg Tablet Take 10 mg by mouth nightly.   ondansetron (ZOFRAN) 4 mg tablet Take 4 mg by mouth.   topiramate (TOPAMAX) 50 mg Tablet TAKE 1 TABLET BY ORAL ROUTE DAILY   Ubrelvy 50 mg Tablet TAKE 1 TABLET BY MOUTH AS NEEDED (MIGRAINE)    No current facility-administered medications for this visit.      Allergies:  Allergies  Allergen Reactions   Nut - Unspecified Hives    peanuts      Review of Systems  Constitutional:   Negative for activity change, appetite change, chills,   fatigue  and fever.  HENT:  Negative for congestion and sore throat.  Eyes:  Negative for pain and itching.  Respiratory:  Negative for cough, shortness of breath and wheezing.  Cardiovascular:  Negative for chest pain, palpitations and leg swelling.  Gastrointestinal:  Negative for abdominal distention, abdominal pain,  constipation, diarrhea, nausea and vomiting.  Endocrine: Negative for cold intolerance and polyuria.  Genitourinary:  Negative for difficulty urinating, dyspareunia, dysuria,  frequency, menstrual problem, pelvic pain, vaginal discharge and vaginal pain.  Musculoskeletal:  Negative for back pain and neck pain.  Skin:  Negative for color change and rash.  Neurological:  Negative for dizziness, light-headedness and headaches.  Hematological:  Negative for adenopathy. Does not bruise/bleed easily.  Psychiatric/Behavioral:  Negative for confusion and suicidal ideas. The   patient  is not nervous/anxious.        Objective    Objective:  BP 129/83   Pulse 65   Ht 5\' 8"  (1.727 m)  Wt 175 lb 14.4 oz (79.8 kg)    LMP 07/31/2023 (Exact Date)   BMI 26.75 kg/m???  Physical Exam  Vitals reviewed.  Constitutional:     Appearance: She is well-developed.  HENT:     Head: Normocephalic and atraumatic.  Eyes:     General: No scleral icterus.     Conjunctiva/sclera: Conjunctivae normal.  Neck:     Thyroid: No thyromegaly.     Trachea: No tracheal deviation.  Pulmonary:     Effort: Pulmonary effort is normal. No respiratory distress.  Abdominal:     General: There is no distension.     Palpations: Abdomen is soft. There is no mass.     Tenderness: There is no abdominal tenderness. There is no guarding or  rebound.     Comments: No organomegaly<br>  Genitourinary:     Labia:     Right: No rash or lesion.     Left: No rash or lesion.     Vagina: No signs of injury. No vaginal discharge or erythema.     Cervix: No cervical motion tenderness, discharge or  friability.     Uterus: Not deviated, not enlarged and not tender.     Adnexa:     Right: No mass, tenderness or fullness.     Left: No mass, tenderness or fullness.     Comments: Symmetric breast exam without masses or nipple discharge, no  axillary lymph node enlargement    Urethral meatus visualized in normal anatomical position without lesions,  prolapse or inflammation    Bladder without tenderness, mass or prolapse  Musculoskeletal:     Cervical back: Neck supple.  Lymphadenopathy:     Cervical: No cervical adenopathy.  Skin:     General: Skin is warm.     Findings: No erythema.  Neurological:     Mental Status: She is alert and oriented to person, place, and time.  Psychiatric:     Behavior: Behavior normal.     Thought Content: Thought content normal.        Assessment  Assessment:      ICD-10-CM  1. Encounter for well woman exam with routine gynecological exam  Z01.419    2. IUD (intrauterine device) in place  Z97.5        Plan:  No orders of the defined types were placed in this encounter.    Encounter Meds    Clinical breast exam was done, and self breast exam and self breast awareness  reviewed.  Pelvic exam was done, and ASCCP guidelines reviewed.  Contraceptive surveillance - IUD  Sexually Transmitted Infection Screening - no  Gardisil - na  Mammogram - plan high risk breast clinic at age 25 with imaging  DEXA - na  Colonoscopy - na  Healthy weight recommendations reviewed - Body mass index is 26.75 kg/m???.  Follow-up - 1 year    Skin tag removed with lidocaine and scissors without issue.      Patient was offered chaperone and declined

## 2023-09-01 MED FILL — FREMANEZUMAB-VFRM 225 MG/1.5 ML SUBCUTANEOUS AUTO-INJECTOR: 225 225 mg/1.5 mL | SUBCUTANEOUS | 30 days supply | Qty: 1.5 | Fill #4

## 2023-09-13 ENCOUNTER — Ambulatory Visit
Admit: 2023-09-13 | Discharge: 2023-09-13 | Payer: Medicaid (Managed Care) | Attending: Family Medicine | Primary: Family Medicine

## 2023-09-13 VITALS — BP 120/68 | HR 66 | Temp 97.90000°F | Ht 67.0 in | Wt 176.8 lb

## 2023-09-13 DIAGNOSIS — R1010 Upper abdominal pain, unspecified: Secondary | ICD-10-CM

## 2023-09-13 MED ORDER — PANTOPRAZOLE SODIUM 40 MG PO TBEC
40 | ORAL_TABLET | Freq: Two times a day (BID) | ORAL | 1 refills | 30.00000 days | Status: DC
Start: 2023-09-13 — End: 2023-10-06

## 2023-09-13 NOTE — Progress Notes (Signed)
 09/13/2023     Erica Gibbs (DOB:  Oct 06, 1995) is a 28 y.o. female, here for evaluation of the following medical concerns:    HPI  Abdominal Pain  Patient complains of abdominal pain. The pain is described as aching, and is 4/10 in intensity. The patient is experiencing epigastric pain without radiation. Onset was 1 week ago. Symptoms have been gradually worsening. Aggravating factors: coffee and hot liquids.  Alleviating factors: none. Associated symptoms: none. The patient denies belching, constipation, diarrhea, fever, hematochezia, melena, nausea, sweats, and vomiting.    Review of Systems   Constitutional:  Negative for chills and fever.   Gastrointestinal:  Positive for abdominal pain. Negative for diarrhea, nausea and vomiting.   Genitourinary:  Negative for difficulty urinating, dyspareunia, dysuria, flank pain, frequency, urgency and vaginal discharge.   Neurological:  Negative for weakness.       Prior to Visit Medications    Medication Sig Taking? Authorizing Provider   pantoprazole (PROTONIX) 40 MG tablet Take 1 tablet by mouth 2 times daily (before meals) Yes Arliss Lam, DO   Fremanezumab -vfrm (AJOVY ) 225 MG/1.5ML SOAJ Inject 225 mg into the skin every 30 days Once monthly Yes Arliss Lam, DO   NURTEC 75 MG TBDP Take 75 mg by mouth daily Yes [provider]   FOLIC ACID PO Take by mouth Yes [provider]   topiramate (TOPAMAX) 50 MG tablet Take 1 tablet by mouth daily Yes [provider]   montelukast  (SINGULAIR ) 10 MG tablet Take 1 tablet by mouth nightly Yes Arliss Lam, DO   levocetirizine (XYZAL ) 5 MG tablet Take 1 tablet by mouth nightly Yes Arliss Lam, DO   albuterol  sulfate HFA (PROVENTIL ;VENTOLIN ;PROAIR ) 108 (90 Base) MCG/ACT inhaler INHALE 2 PUFFS INTO THE LUNGS 4 TIMES DAILY AS NEEDED FOR WHEEZING. Yes Arliss Lam, DO   ondansetron (ZOFRAN) 4 MG tablet Take 1 tablet by mouth every 8 hours as needed for Nausea or Vomiting prn Yes [provider]         Social History     Tobacco Use    Smoking status: Never    Smokeless tobacco: Never   Substance Use Topics    Alcohol use: Yes     Alcohol/week: 3.0 standard drinks of alcohol     Types: 3 Glasses of wine per week     Comment: occ        Vitals:    09/13/23 1147   BP: 120/68   Pulse: 66   Temp: 97.9 F (36.6 C)   TempSrc: Temporal   Weight: 80.2 kg (176 lb 12.8 oz)   Height: 1.702 m (5\' 7" )     Estimated body mass index is 27.69 kg/m as calculated from the following:    Height as of this encounter: 1.702 m (5\' 7" ).    Weight as of this encounter: 80.2 kg (176 lb 12.8 oz).    Physical Exam  Vitals reviewed.   Constitutional:       Appearance: Normal appearance.   HENT:      Head: Normocephalic and atraumatic.      Nose: Nose normal.      Mouth/Throat:      Pharynx: Oropharynx is clear.   Eyes:      Conjunctiva/sclera: Conjunctivae normal.   Cardiovascular:      Rate and Rhythm: Normal rate and regular rhythm.   Pulmonary:      Effort: Pulmonary effort is normal.      Breath sounds: Normal breath sounds.  Abdominal:      General: Abdomen is flat. Bowel sounds are normal.      Palpations: Abdomen is soft.      Tenderness: There is abdominal tenderness (mild midepigastric). There is no guarding or rebound.   Skin:     General: Skin is warm and dry.      Capillary Refill: Capillary refill takes less than 2 seconds.   Neurological:      Mental Status: She is alert. Mental status is at baseline.   Psychiatric:         Mood and Affect: Mood normal.         Behavior: Behavior normal.         Thought Content: Thought content normal.         Judgment: Judgment normal.     ASSESSMENT/PLAN:  1. Upper abdominal pain  Midepigastric abdominal pain, which is suggestive of gastritis.  Provided patient with a list of lifestyle modifications.  Starting high-dose PPIs as below.  Follow-up if symptoms fail to improve over the next 4 to 6 weeks.  - pantoprazole (PROTONIX) 40 MG tablet; Take 1 tablet by mouth 2 times daily (before  meals)  Dispense: 60 tablet; Refill: 1      Return if symptoms worsen or fail to improve.    An electronic signature was used to authenticate this note.    --Arliss Lam, DO on 09/13/2023 at 12:04 PM

## 2023-09-20 MED ORDER — MONTELUKAST SODIUM 10 MG PO TABS
10 | ORAL_TABLET | Freq: Every evening | ORAL | 1 refills | 90.00 days | Status: AC
Start: 2023-09-20 — End: ?

## 2023-09-20 NOTE — Telephone Encounter (Signed)
 Medication:   Requested Prescriptions     Pending Prescriptions Disp Refills    montelukast  (SINGULAIR ) 10 MG tablet [Pharmacy Med Name: MONTELUKAST  SOD 10 MG TABLET] 90 tablet 1     Sig: TAKE 1 TABLET BY MOUTH EVERY DAY AT NIGHT        Last Filled:  11/29/22    Patient Phone Number: [phone] (home)     Last appt: 09/13/2023   Return in about 1 year (around 11/29/2023) for [employer].     Next appt: Visit date not found    Last OARRS:        No data to display

## 2023-10-06 ENCOUNTER — Encounter

## 2023-10-06 MED ORDER — PANTOPRAZOLE SODIUM 40 MG PO TBEC
40 | ORAL_TABLET | Freq: Two times a day (BID) | ORAL | 1 refills | 30.00000 days | Status: DC
Start: 2023-10-06 — End: 2024-04-11

## 2023-10-06 NOTE — Telephone Encounter (Signed)
 Medication:   Requested Prescriptions     Pending Prescriptions Disp Refills    pantoprazole  (PROTONIX ) 40 MG tablet [Pharmacy Med Name: PANTOPRAZOLE  SOD DR 40 MG TAB] 180 tablet 1     Sig: TAKE 1 TABLET BY MOUTH TWICE A DAY BEFORE MEALS        Last Filled:    09/13/23  Patient Phone Number: [phone] (home)     Last appt: 09/13/2023   Next appt: Visit date not found    Last OARRS:        No data to display

## 2023-10-07 MED FILL — FREMANEZUMAB-VFRM 225 MG/1.5 ML SUBCUTANEOUS AUTO-INJECTOR: 225 225 mg/1.5 mL | SUBCUTANEOUS | 30 days supply | Qty: 1.5 | Fill #5

## 2023-10-10 NOTE — Telephone Encounter (Addendum)
 [facility] Note     Prescription received for: Ajovy      Insurance Plan: [employer] rejection message: PA required     Insurance prior authorization process started via: CMM     Using EPIC chart as reference, provided requested information to insurance including:  diagnosis code and treatment history     Claim status: approved

## 2023-10-11 ENCOUNTER — Other Ambulatory Visit: Admit: 2023-10-11 | Payer: Medicaid (Managed Care)

## 2023-10-11 ENCOUNTER — Ambulatory Visit: Admit: 2023-10-11 | Discharge: 2023-10-11 | Payer: Medicaid (Managed Care)

## 2023-10-11 DIAGNOSIS — Z111 Encounter for screening for respiratory tuberculosis: Secondary | ICD-10-CM

## 2023-10-11 LAB — QUANTIFERON TB2 AG: QuantiFERON TB2 Ag Value: 0.66

## 2023-10-11 LAB — QUANTIFERON MITOGEN
QuantiFERON Interpretation: POSITIVE — AB
QuantiFERON Mitogen: 10

## 2023-10-11 LAB — QUANTIFERON NIL: QuantiFERON Nil: 0.05

## 2023-10-11 LAB — QUANTIFERON TB1 AG: QuantiFERON TB1 Ag Value: 0.91

## 2023-10-11 NOTE — Progress Notes (Signed)
 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 TB screening form completed and reviewed. 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 States that she was told recently that she still had to get another updated QFT for medical program. No recent live immunizations received by patient. Lab order placed, walked patient to lab. Patient has access to MyChart to obtain results of QFT. Will follow-up as needed.

## 2023-11-01 NOTE — Telephone Encounter (Signed)
 [facility]: Pharmacist Reassessment     Mia Brooks is a 28 y.o. patient for which has been prescribed Ajovy  for migraine prevention.      Medication and Allergy Reconciliation:   Patient's medications, natural supplements, and allergies were reviewed with the patient and updated as applicable.  Changes to medical history or any new prescription or non-prescription medications or herbal supplements:  now using Flonase seasonally.     Current Outpatient Medications   Medication Sig Dispense Refill    fluticasone propionate (FLONASE) 50 mcg/actuation nasal spray Use 1 spray into each nostril daily. Seasonally      folic acid  (FOLVITE ) 1 MG tablet Take 1 tablet (1 mg total) by mouth daily. 90 tablet 3    fremanezumab -vfrm 225 mg/1.5 mL AtIn Inject the contents of 1 pen (1.5 mLs, 225 mg total) subcutaneously every 28 days. 1.5 mL 12    ondansetron  (ZOFRAN -ODT) 4 MG disintegrating tablet Take 1 tablet (4 mg total) by mouth every 8 hours as needed for Nausea. 30 tablet 5    rimegepant 75 mg TbDL Dissolve 1 tablet (75 mg total) in mouth once daily as needed for migraine. 8 tablet 11    topiramate  (TOPAMAX ) 50 MG tablet Take 1 tablet (50 mg total) by mouth daily. Indications: Migraine Prevention 90 tablet 3       Allergies[1]    Assessment of Patient's Therapy and Barriers:   - How patient is currently taking specialty medication (dose/frequency): Ajovy  225mg  every 28 days    - Issues (if any) patient has had with taking medication as prescribed: none   - Patient's perception of effectiveness of medication therapy: adequate or expected response Pt reports decrease in migraine days since CGRP initiation (prior to initiation: 10/month; now: 0-3/month),  severity now: 7/10 pain scale, decrease in duration (prior to initiation: 12-24 hours; now: 1-2 hours), and decreased abortive therapy use with Nurtec about 3 per month.  The Nurtec works well to resolve her migraines within 1-2 hours without adverse  effects.   - Missed days from work/school/planned activities: 0 missed events or work   - Hospitalization, urgent care visit, or unplanned doctor visit in last 30 days: 0   - In general, patient states QOL is Excellent    Recent Neurology Visits  Visit Information       Date & Time  05/06/2023  4:15 PM Provider  Glendia JONETTA Poisson, MD Department  [facility] Encounter #  [id number]             - Assessment of continued appropriateness of therapy: appropriate  - Assessment of continued effectiveness of therapy: met treatment goal    Adherence and Counseling:   - Late or missed doses in past 4 weeks: 0   - Assessment of patient's medication adherence: followed as prescribed   - Patient's confidence in ability to administer medication and follow treatment plan as prescribed (0= least confident, 10=most confident): 10     Adverse Effects and Patient Issues:    - Side effects (if any) experienced while on treatment: none   - Assessment of occurrence of medication adverse effects: none     - Acute infection/illness: unknown     - Family planning: not pregnant or expecting to become pregnant     - Birth control method: I.U.D.    Specialty Pharmacy Care Management Plan:     1. Needs assessment: none identified    2. Strategies to address needs:  not applicable    3. Goals and time frame: Reduction in migraine days, severity, duration + increased effectiveness of abortive therapy     4. Resources available to implement care management plan: not applicable    5. Patient's motivation: high    6. Reassessment due in 12 months.     Treatment based on AHS guidelines.        [1]   Allergies  Allergen Reactions    Peanut Hives

## 2023-11-02 MED FILL — RIMEGEPANT 75 MG DISINTEGRATING TABLET: 75 75 mg | ORAL | 30 days supply | Qty: 8 | Fill #2

## 2023-11-02 MED FILL — FREMANEZUMAB-VFRM 225 MG/1.5 ML SUBCUTANEOUS AUTO-INJECTOR: 225 225 mg/1.5 mL | SUBCUTANEOUS | 30 days supply | Qty: 1.5 | Fill #6

## 2023-12-01 MED FILL — FREMANEZUMAB-VFRM 225 MG/1.5 ML SUBCUTANEOUS AUTO-INJECTOR: 225 225 mg/1.5 mL | SUBCUTANEOUS | 30 days supply | Qty: 1.5 | Fill #7

## 2023-12-19 NOTE — Telephone Encounter (Signed)
 Medication:   Requested Prescriptions     Pending Prescriptions Disp Refills    levocetirizine (XYZAL ) 5 MG tablet [Pharmacy Med Name: LEVOCETIRIZINE 5 MG TABLET] 30 tablet 5     Sig: TAKE 1 TABLET BY MOUTH EVERY DAY AT NIGHT        Last Filled:      Patient Phone Number: [phone] (home)     Last appt: 09/13/2023   Next appt: Visit date not found    Last OARRS:        No data to display

## 2023-12-27 MED ORDER — LEVOCETIRIZINE DIHYDROCHLORIDE 5 MG PO TABS
5 | ORAL_TABLET | Freq: Every evening | ORAL | 1 refills | Status: AC
Start: 2023-12-27 — End: ?

## 2023-12-27 NOTE — Telephone Encounter (Signed)
 Medication:   Requested Prescriptions     Pending Prescriptions Disp Refills    levocetirizine (XYZAL ) 5 MG tablet 90 tablet 1     Sig: Take 1 tablet by mouth nightly        Last Filled:  11/29/22    Patient Phone Number: [phone] (home)     Last appt: 09/13/2023  Return in about 1 year (around 11/29/2023) for Annual Wellness.  Next appt: Visit date not found    Last OARRS:        No data to display                Patient comment: Can you please refill? [facility] said you denied request on 8/17. I can buy OTC if i really need to, but would prefer orescription since its cheaper. Lmk if you need more info!

## 2024-01-04 MED FILL — FREMANEZUMAB-VFRM 225 MG/1.5 ML SUBCUTANEOUS AUTO-INJECTOR: 225 225 mg/1.5 mL | SUBCUTANEOUS | 30 days supply | Qty: 1.5 | Fill #8

## 2024-01-04 NOTE — Telephone Encounter (Addendum)
 [facility] Note    Prescription refill received for: Nurtec PRN    Insurance rejection message: PA required    Insurance prior authorization process started via: CMM    Using EPIC chart as reference, provided requested information to insurance including:  diagnosis code    Claim status: approved.     Key:  [id number]        Copay = $0  Karleen Caralee Jolaine Berdine JONETTA  [facility]  [phone]  01/04/24

## 2024-01-05 MED FILL — RIMEGEPANT 75 MG DISINTEGRATING TABLET: 75 75 mg | ORAL | 30 days supply | Qty: 8 | Fill #3

## 2024-02-02 MED FILL — FREMANEZUMAB-VFRM 225 MG/1.5 ML SUBCUTANEOUS AUTO-INJECTOR: 225 225 mg/1.5 mL | SUBCUTANEOUS | 30 days supply | Qty: 1.5 | Fill #9

## 2024-02-29 MED FILL — FREMANEZUMAB-VFRM 225 MG/1.5 ML SUBCUTANEOUS AUTO-INJECTOR: 225 225 mg/1.5 mL | SUBCUTANEOUS | 30 days supply | Qty: 1.5 | Fill #10

## 2024-04-10 MED FILL — RIMEGEPANT 75 MG DISINTEGRATING TABLET: 75 75 mg | ORAL | 30 days supply | Qty: 8 | Fill #4

## 2024-04-10 MED FILL — FREMANEZUMAB-VFRM 225 MG/1.5 ML SUBCUTANEOUS AUTO-INJECTOR: 225 225 mg/1.5 mL | SUBCUTANEOUS | 30 days supply | Qty: 1.5 | Fill #11

## 2024-04-11 ENCOUNTER — Encounter

## 2024-04-11 ENCOUNTER — Ambulatory Visit
Admit: 2024-04-11 | Discharge: 2024-04-11 | Payer: Medicaid (Managed Care) | Attending: Family Medicine | Primary: Family Medicine

## 2024-04-11 ENCOUNTER — Ambulatory Visit: Payer: Medicaid (Managed Care)

## 2024-04-11 MED ORDER — IPRATROPIUM BROMIDE HFA 17 MCG/ACT IN AERS
17 | Freq: Three times a day (TID) | RESPIRATORY_TRACT | 5 refills | Status: AC
Start: 2024-04-11 — End: 2025-04-11

## 2024-04-11 NOTE — Progress Notes (Signed)
 "     04/11/2024    Erica Gibbs (DOB:  21-Mar-1996) is a 28 y.o. female, here for evaluation of the following medical concerns:    HPI    Well Adult Physical: Patient here for a comprehensive physical exam.The patient reports no problems.  Do you take any herbs or supplements that were not prescribed by a doctor? no Are you taking calcium supplements? not applicable Are you taking aspirin daily? not applicable    Sexual activity: single partner, contraception - condoms   Diet: Healthy  Exercise: aerobics 2-4 time(s) per week  Seatbelt use: yes    Review of Systems   Constitutional:  Negative for activity change, appetite change, fatigue and unexpected weight change.   HENT:  Negative for hearing loss.    Eyes:  Negative for visual disturbance.   Respiratory:  Negative for chest tightness, shortness of breath and stridor.    Cardiovascular:  Negative for chest pain and palpitations.   Gastrointestinal:  Negative for abdominal pain, blood in stool, constipation and diarrhea.   Endocrine: Negative for polydipsia, polyphagia and polyuria.   Genitourinary:  Negative for dysuria, genital sores, menstrual problem, pelvic pain, vaginal bleeding, vaginal discharge and vaginal pain.   Musculoskeletal:  Negative for arthralgias and back pain.   Skin:  Negative for rash.   Allergic/Immunologic: Negative for environmental allergies.   Neurological:  Negative for dizziness, syncope and headaches.   Hematological:  Negative for adenopathy.   Psychiatric/Behavioral:  Negative for dysphoric mood. The patient is not nervous/anxious.        Prior to Visit Medications   Medication Sig Taking? Authorizing Provider   ipratropium (ATROVENT  HFA) 17 MCG/ACT inhaler Inhale 1 puff into the lungs 3 times daily Yes Ilka Lovick, DO   levocetirizine (XYZAL ) 5 MG tablet Take 1 tablet by mouth nightly Yes Amos Leisure, DO   montelukast  (SINGULAIR ) 10 MG tablet TAKE 1 TABLET BY MOUTH EVERY DAY AT NIGHT Yes Amos Leisure, DO   Fremanezumab -vfrm (AJOVY ) 225  MG/1.5ML SOAJ Inject 225 mg into the skin every 30 days Once monthly Yes Amos Leisure, DO   NURTEC 75 MG TBDP Take 75 mg by mouth daily Yes [provider]   FOLIC ACID PO Take by mouth Yes [provider]   topiramate (TOPAMAX) 50 MG tablet Take 1 tablet by mouth daily Yes [provider]   ondansetron (ZOFRAN) 4 MG tablet Take 1 tablet by mouth every 8 hours as needed for Nausea or Vomiting prn Yes [provider]        Allergies   Allergen Reactions    Peanut Allergen Powder-Dnfp Hives       Past Medical History:   Diagnosis Date    Allergic rhinitis     Asthma     Migraines        History reviewed. No pertinent surgical history.    Social History     Socioeconomic History    Marital status: Single     Spouse name: Not on file    Number of children: Not on file    Years of education: Not on file    Highest education level: Not on file   Occupational History    Not on file   Tobacco Use    Smoking status: Never    Smokeless tobacco: Never   Vaping Use    Vaping status: Never Used   Substance and Sexual Activity    Alcohol use: Yes     Alcohol/week:  3.0 standard drinks of alcohol     Types: 3 Glasses of wine per week     Comment: occ    Drug use: Never    Sexual activity: Yes     Partners: Male   Other Topics Concern    Not on file   Social History Narrative    Not on file     Social Drivers of Health     Financial Resource Strain: Low Risk  (11/29/2022)    Overall Financial Resource Strain (CARDIA)     Difficulty of Paying Living Expenses: Not hard at all   Food Insecurity: No Food Insecurity (09/13/2023)    Hunger Vital Sign     Worried About Running Out of Food in the Last Year: Never true     Ran Out of Food in the Last Year: Never true   Transportation Needs: No Transportation Needs (09/13/2023)    PRAPARE - Therapist, Art (Medical): No     Lack of Transportation (Non-Medical): No   Physical Activity: Not on file   Stress: Not on file   Social  Connections: Unknown (09/15/2021)    Received from [facility]    Social Network     Social Network: Not on file   Intimate Partner Violence: Unknown (08/07/2021)    Received from [facility]    HITS     Physically Hurt: Not on file     Insult or Talk Down To: Not on file     Threaten Physical Harm: Not on file     Scream or Curse: Not on file   Housing Stability: Low Risk  (09/13/2023)    Housing Stability Vital Sign     Unable to Pay for Housing in the Last Year: No     Number of Times Moved in the Last Year: 0     Homeless in the Last Year: No        Family History   Problem Relation Age of Onset    Hypertension Mother     Graves Disease Mother     High Blood Pressure Mother     Other Mother         Graves disease    Hypertension Father     High Blood Pressure Father     High Cholesterol Father 37 - 25    Breast Cancer Maternal Grandmother 30 - 82    Stroke Paternal Grandfather 18 - 73    Asthma Brother 0 - 9    Asthma Maternal Aunt        Vitals:    04/11/24 0859   BP: 119/65   Pulse: 64   Temp: 97.3 F (36.3 C)   TempSrc: Temporal   SpO2: 99%   Weight: 79.5 kg (175 lb 3.2 oz)   Height: 1.702 m (5' 7)     Estimated body mass index is 27.44 kg/m as calculated from the following:    Height as of this encounter: 1.702 m (5' 7).    Weight as of this encounter: 79.5 kg (175 lb 3.2 oz).    Physical Exam  Vitals reviewed.   Constitutional:       Appearance: Normal appearance. She is normal weight.   HENT:      Head: Normocephalic and atraumatic.      Right Ear: Tympanic membrane, ear canal and external ear normal.      Left Ear: Tympanic membrane, ear canal and external ear normal.  Nose: Nose normal.      Mouth/Throat:      Mouth: Mucous membranes are moist.      Pharynx: Oropharynx is clear.   Eyes:      Extraocular Movements: Extraocular movements intact.      Conjunctiva/sclera: Conjunctivae normal.   Cardiovascular:      Rate and Rhythm: Normal rate and regular rhythm.      Pulses: Normal pulses.       Heart sounds: Normal heart sounds.   Pulmonary:      Effort: Pulmonary effort is normal.      Breath sounds: Normal breath sounds.   Abdominal:      General: Abdomen is flat. Bowel sounds are normal.      Palpations: Abdomen is soft.   Musculoskeletal:         General: Normal range of motion.      Cervical back: Normal range of motion and neck supple.   Skin:     General: Skin is warm and dry.      Capillary Refill: Capillary refill takes less than 2 seconds.      Findings: No rash.   Neurological:      General: No focal deficit present.      Mental Status: She is alert and oriented to person, place, and time. Mental status is at baseline.   Psychiatric:         Mood and Affect: Mood normal.         Behavior: Behavior normal.         Thought Content: Thought content normal.         Judgment: Judgment normal.         Separate Identifiable issues addressed today:      ASSESSMENT/PLAN:  Torrence was seen today for annual exam.    Diagnoses and all orders for this visit:    Routine general medical examination at a health care facility: Vitals reviewed and within normal limits.  BMI nonobese.  Depression screen negative.  Reviewed lifestyle with patient and recommended appropriate modifications.    Family history of skin cancer  -     [place] - Tricia Covert, MD, Dermatology, [place]    Screening for hypercholesterolemia  -     Lipid, Fasting; Future    Need for pneumococcal 20-valent conjugate vaccination  -     Pneumococcal, PCV20, PREVNAR 20, (age 6w+), IM, PF    Return in about 1 year (around 04/11/2025) for [employer].    An  electronic signature was used to authenticate this note.    --Marty Kinnier, DO on 04/11/2024 at 9:35 AM    "

## 2024-04-11 NOTE — Patient Instructions (Signed)
"  Patient Education        Well Visit, Ages 106 to 62: Care Instructions  Well visits can help you stay healthy. Your doctor has checked your overall health and may have suggested ways to take good care of yourself. Your doctor also may have recommended tests. You can help prevent illness with healthy eating, good sleep, vaccinations, regular exercise, and other steps.    Get the tests that you and your doctor decide on. Depending on your age and risks, examples might include screening for diabetes; hepatitis C; HIV; and cervical, breast, lung, and colon cancer. Screening helps find diseases before any symptoms appear.   Eat healthy foods. Choose fruits, vegetables, whole grains, lean protein, and low-fat dairy foods. Limit saturated fat and reduce salt.     Limit alcohol. Men should have no more than 2 drinks a day. Women should have no more than 1. For some people, no alcohol is the best choice.   Exercise. Get at least 30 minutes of exercise on most days of the week. Walking can be a good choice.     Reach and stay at your healthy weight. This will lower your risk for many health problems.   Take care of your mental health. Try to stay connected with friends, family, and community, and find ways to manage stress.     If you're feeling depressed or hopeless, talk to someone. A counselor can help. If you don't have a counselor, talk to your doctor.   Talk to your doctor if you think you may have a problem with alcohol or drug use. This includes prescription medicines, marijuana, and other drugs.     Avoid tobacco and nicotine: Don't smoke, vape, or chew. If you need help quitting, talk to your doctor.   Practice safer sex. Getting tested, using condoms or dental dams, and limiting sex partners can help prevent STIs.     Use birth control if it's important to you to prevent pregnancy. Talk with your doctor about your choices and what might be best for you.   Prevent problems where you can. Protect your skin from too  much sun, wash your hands, brush your teeth twice a day, and wear a seat belt in the car.   Where can you learn more?  Go to [employer] and enter P072 to learn more about Well Visit, Ages 14 to 9: Care Instructions.  Current as of: November 01, 2023  Content Version: 14.6   2024-2025 [place], [place].   Care instructions adapted under license by [facility] If you have questions about a medical condition or this instruction, always ask your healthcare professional. Romayne Alderman, [facility], disclaims any warranty or liability for your use of this information.       "

## 2024-04-12 LAB — COMPREHENSIVE METABOLIC PANEL
ALT: 23 U/L (ref 10–40)
AST: 18 U/L (ref 15–37)
Albumin/Globulin Ratio: 1.9 (ref 1.1–2.2)
Albumin: 4.6 g/dL (ref 3.4–5.0)
Alkaline Phosphatase: 45 U/L (ref 40–129)
Anion Gap: 10 (ref 3–16)
BUN: 8 mg/dL (ref 7–20)
CO2: 24 mmol/L (ref 21–32)
Calcium: 9.2 mg/dL (ref 8.3–10.6)
Chloride: 105 mmol/L (ref 99–110)
Creatinine: 0.7 mg/dL (ref 0.6–1.1)
Est, Glom Filt Rate: >90
Glucose: 80 mg/dL (ref 70–99)
Potassium: 4.6 mmol/L (ref 3.5–5.1)
Sodium: 139 mmol/L (ref 136–145)
Total Bilirubin: 0.4 mg/dL (ref 0.0–1.0)
Total Protein: 7 g/dL (ref 6.4–8.2)

## 2024-04-12 LAB — CBC
Hematocrit: 40.3 % (ref 36.0–48.0)
Hemoglobin: 13.3 g/dL (ref 12.0–16.0)
MCH: 29 pg (ref 26.0–34.0)
MCHC: 32.9 g/dL (ref 31.0–36.0)
MCV: 88.2 fL (ref 80.0–100.0)
MPV: 9.2 fL (ref 5.0–10.5)
Platelets: 267 K/uL (ref 135–450)
RBC: 4.57 M/uL (ref 4.00–5.20)
RDW: 13.7 % (ref 12.4–15.4)
WBC: 5.6 K/uL (ref 4.0–11.0)

## 2024-04-12 LAB — LIPID, FASTING
Cholesterol, Fasting: 172 mg/dL (ref 0–199)
HDL: 75 mg/dL — ABNORMAL HIGH (ref 40–60)
LDL Cholesterol: 83 mg/dL (ref <100)
Triglyceride, Fasting: 71 mg/dL (ref 0–150)
VLDL Cholesterol Calculated: 14 mg/dL

## 2024-04-12 LAB — TSH REFLEX TO FT4: TSH Reflex FT4: 1.93 u[IU]/mL (ref 0.27–4.20)

## 2024-04-14 LAB — TESTOSTERONE, FREE, TOTAL
Sex Hormone Binding: 38 nmol/L (ref 25–122)
Testosterone, Free: 5.4 pg/mL (ref 0.8–7.4)
Testosterone: 33 ng/dL (ref 8–48)

## 2024-05-07 ENCOUNTER — Ambulatory Visit: Payer: Medicaid (Managed Care)

## 2024-05-07 MED FILL — FREMANEZUMAB-VFRM 225 MG/1.5 ML SUBCUTANEOUS AUTO-INJECTOR: 225 225 mg/1.5 mL | SUBCUTANEOUS | 30 days supply | Qty: 1.5 | Fill #12

## 2024-05-14 ENCOUNTER — Encounter
Admit: 2024-05-14 | Discharge: 2024-05-14 | Payer: Medicaid (Managed Care) | Attending: Family | Primary: Family Medicine

## 2024-05-14 DIAGNOSIS — J111 Influenza due to unidentified influenza virus with other respiratory manifestations: Principal | ICD-10-CM

## 2024-05-14 MED ORDER — OSELTAMIVIR PHOSPHATE 75 MG PO CAPS
75 | ORAL_CAPSULE | Freq: Two times a day (BID) | ORAL | 0 refills | Status: AC
Start: 2024-05-14 — End: 2024-05-19

## 2024-05-14 NOTE — Patient Instructions (Signed)
 Notify office if you do not improve or have worsening of condition.  Take medication as prescribed.   Most consistent with viral illness and antibiotics will not help at this time, however, continue to monitor if no improvement or worsening could develop in to bacterial infection and can be treated with antibiotics.  Drink plenty of fluids and rest.  Practice good hand hygiene.  May use Cepacol lozenges, or chloraseptic spray.  May sleep with humidifier as needed.  Gargle with warm salt water 3-4 times a day to help with sore throat. Warm tea with honey.  You can use ice, warm chicken noodle soup, soft foods, popsicles and cough drops to help soothe your throat.  May take Tylenol/Ibuprofen (alternate) as needed for fever/pain.  Do not eat or drink after anyone.   Do not share utensils.  Cover your mouth and nose when you cough or sneeze.  Follow up with PCP in 3-4 days if not improving or sooner if worsening.  Please refer to educational handout with AVS.

## 2024-05-14 NOTE — Progress Notes (Signed)
 [facility] Primary [facility] Virtualist Encounter Note       Chief Complaint   Patient presents with    Influenza     Chills, cough, fatigue, body aches, boyfriend is flu A+       HPI:    Erica Gibbs (DOB:  07-Jan-1996) has requested an audio/video evaluation for the following concern(s):    This is an established patient of Dr. Elwin. This is the first time I am seeing the patient today. She requested this visit for the flu, boyfriend she lives with also positive with flu A. Symptoms started this morning chills, cough, fatigue, body aches. She does have asthma. Denies fever. No wheezing. No SOB. No CP. No N/V/D.    Review of Systems   Constitutional:  Positive for chills and fatigue. Negative for fever.   HENT:  Positive for congestion.    Respiratory:  Positive for cough. Negative for chest tightness, shortness of breath and wheezing.    Cardiovascular:  Negative for chest pain.   Gastrointestinal:  Negative for diarrhea, nausea and vomiting.   Genitourinary: Negative.    Musculoskeletal:  Positive for myalgias.       Prior to Visit Medications   Medication Sig Taking? Authorizing Provider   oseltamivir  (TAMIFLU ) 75 MG capsule Take 1 capsule by mouth 2 times daily for 5 days Yes Abdulrahman Bracey, Donzell CROME, APRN - CNP   ipratropium (ATROVENT  HFA) 17 MCG/ACT inhaler Inhale 1 puff into the lungs 3 times daily Yes Daley, Joel, DO   levocetirizine (XYZAL ) 5 MG tablet Take 1 tablet by mouth nightly Yes Amos Leisure, DO   montelukast  (SINGULAIR ) 10 MG tablet TAKE 1 TABLET BY MOUTH EVERY DAY AT NIGHT Yes Amos Leisure, DO   Fremanezumab -vfrm (AJOVY ) 225 MG/1.5ML SOAJ Inject 225 mg into the skin every 30 days Once monthly Yes Amos Leisure, DO   NURTEC 75 MG TBDP Take 75 mg by mouth daily Yes [provider]   FOLIC ACID PO Take by mouth Yes [provider]   topiramate (TOPAMAX) 50 MG tablet Take 1 tablet by mouth daily Yes [provider]   ondansetron (ZOFRAN) 4 MG tablet Take 1 tablet by mouth  every 8 hours as needed for Nausea or Vomiting prn Yes [provider]       Social History     Tobacco Use    Smoking status: Never    Smokeless tobacco: Never   Vaping Use    Vaping status: Never Used   Substance Use Topics    Alcohol use: Yes     Alcohol/week: 3.0 standard drinks of alcohol     Types: 3 Glasses of wine per week     Comment: occ    Drug use: Never        Allergies   Allergen Reactions    Peanut Allergen Powder-Dnfp Hives   ,   Past Medical History:   Diagnosis Date    Allergic rhinitis     Asthma     Migraines    , No past surgical history on file.,   Social History     Tobacco Use    Smoking status: Never    Smokeless tobacco: Never   Vaping Use    Vaping status: Never Used   Substance Use Topics    Alcohol use: Yes     Alcohol/week: 3.0 standard drinks of alcohol     Types: 3 Glasses of wine per week     Comment: occ  Drug use: Never   ,   Family History   Problem Relation Age of Onset    Hypertension Mother     Graves Disease Mother     High Blood Pressure Mother     Other Mother         Graves disease    Hypertension Father     High Blood Pressure Father     High Cholesterol Father 23 - 2    Breast Cancer Maternal Grandmother 22 - 62    Stroke Paternal Grandfather 87 - 65    Asthma Brother 0 - 9    Asthma Maternal Aunt    ,   Immunization History   Administered Date(s) Administered    COVID-19, COMIRNATY Proofreader), (age 12y+), IM, 9mcg/0.3mL 01/29/2022, 03/25/2023    COVID-19, Inactive, PFIZER PURPLE top, DILUTE for use, (age 72 y+) 03/06/2020    COVID-19, Henry Laren), (age 12y+), IM, 71mcg/0.2mL 01/10/2024    HPV Quadrivalent (Gardasil) 12/31/2008, 07/07/2009, 09/15/2009    Hep A, HAVRIX, VAQTA, (age 19y+), IM, 1mL 11/10/2020    Hep B, ENGERIX-B, RECOMBIVAX-HB, (age Birth - 17y), IM, 0.5mL 11/10/2020    Hepatitis A Vaccine 07/06/2001, 12/05/2001    Hepatitis B vaccine 02/11/1997, 04/08/1997, 09/09/1997    Influenza Virus Vaccine 01/31/2014, 01/08/2015, 02/25/2016,  03/02/2017, 03/01/2018, 02/04/2023, 01/10/2024    Influenza, FLUARIX, FLULAVAL, FLUZONE (age 659 mo+) and AFLURIA, (age 52 y+), Quadv PF, 0.51mL 02/11/2022    Influenza, FLUARIX, FLULAVAL, FLUZONE, (age 659 mo+), AFLURIA, (age 659 y+), IM, Trivalent PF, 0.52mL 02/04/2023    MMR, PRIORIX, M-M-R II, (age 34m+), SC, 0.38mL 12/01/1997, 12/26/2000, 12/19/2014    Meningococcal Vac Of Unknown Formula And Unknown Serogroup 12/14/2007, 01/03/2013    Pneumococcal, PCV20, PREVNAR 20, (age 659w+), IM, 0.19mL 04/11/2024    Polio Virus Vaccine 02/11/1997, 04/08/1997, 06/13/1997, 06/09/1998, 12/05/2001    TDaP, ADACEL (age 31y-64y), BOOSTRIX (age 10y+), IM, 0.81mL 12/14/2007, 06/25/2017, 01/23/2018    Td vaccine (adult) 02/11/1997, 04/08/1997, 06/13/1997, 06/09/1998, 12/05/2001    Typhoid Vi capsular polysaccharide (Typhim VI) 06/03/2010    Varicella, VARIVAX, (age 79m+), SC, 0.38mL 12/10/1997, 12/24/2005       PHYSICAL EXAMINATION:  [ INSTRUCTIONS:  [x]  Indicates a positive item  []  Indicates a negative item  -- DELETE ALL ITEMS NOT EXAMINED]  Vital Signs: (As obtained by patient/caregiver or practitioner observation)    Blood pressure-  Heart rate-    Respiratory rate-    Temperature-  Pulse oximetry-     Constitutional: [x]  Appears well-developed and well-nourished [x]  No apparent distress      [x]  Abnormal-appears not to feel well  Mental status  [x]  Alert and awake  [x]  Oriented to person/place/time [x] Able to follow commands      Eyes:  EOM    [x]   Normal  []  Abnormal-  Sclera  [x]   Normal  []  Abnormal -         Discharge [x]   None visible  []  Abnormal -    HENT:   [x]  Normocephalic, atraumatic.  []  Abnormal   []  Mouth/Throat: Mucous membranes are moist.     External Ears [x]  Normal  []  Abnormal-     Neck: [x]  No visualized mass     Pulmonary/Chest: [x]  Respiratory effort normal.  [x]  No visualized signs of difficulty breathing or respiratory distress        []  Abnormal-      Musculoskeletal:   []  Normal gait with no signs of ataxia          [  x] Normal range of motion of neck        []  Abnormal-       Neurological:        [x]  No Facial Asymmetry (Cranial nerve 7 motor function) (limited exam to video visit)          [x]  No gaze palsy        []  Abnormal-         Skin:        [x]  No significant exanthematous lesions or discoloration noted on facial skin         []  Abnormal-            Psychiatric:       [x]  Normal Affect []  No Hallucinations        []  Abnormal-     Other pertinent observable physical exam findings-     ASSESSMENT/PLAN:  1. Influenza  Notify office if you do not improve or have worsening of condition.  Take medication as prescribed.   Most consistent with viral illness and antibiotics will not help at this time, however, continue to monitor if no improvement or worsening could develop in to bacterial infection and can be treated with antibiotics.  Drink plenty of fluids and rest.  Practice good hand hygiene.  May use Cepacol lozenges, or chloraseptic spray.  May sleep with humidifier as needed.  Gargle with warm salt water 3-4 times a day to help with sore throat. Warm tea with honey.  You can use ice, warm chicken noodle soup, soft foods, popsicles and cough drops to help soothe your throat.  May take Tylenol/Ibuprofen (alternate) as needed for fever/pain.  Do not eat or drink after anyone.   Do not share utensils.  Cover your mouth and nose when you cough or sneeze.  Follow up with PCP in 3-4 days if not improving or sooner if worsening.  Please refer to educational handout with AVS.    - oseltamivir  (TAMIFLU ) 75 MG capsule; Take 1 capsule by mouth 2 times daily for 5 days  Dispense: 10 capsule; Refill: 0      The patient would benefit from future follow up with their usual PCP. As of the end of their Virtualist Visit today, follow up visit status is as follows: No PCP availability      Total time spent on this encounter: 20 minutes    Return Follow-up with PCP in 3-4 days if symptoms worsen or fail to improve, for  Influenza.    Erica Gibbs, was evaluated through a synchronous (real-time) audio-video encounter. The patient (or guardian if applicable) is aware that this is a billable service, which includes applicable co-pays. This Virtual Visit was conducted with patient's (and/or legal guardian's) consent. Patient identification was verified, and a caregiver was present when appropriate.   The patient was located at Home: [address]  [place] [address]  Provider was located at Other: Home: [place]  Confirm you are appropriately licensed, registered, or certified to deliver care in the state where the patient is located as indicated above. If you are not or unsure, please re-schedule the visit: Yes, I confirm.        --Donzell LITTIE Exon, APRN - CNP on 05/14/2024 at 1:54 PM    An electronic signature was used to authenticate this note.

## 2024-05-30 ENCOUNTER — Ambulatory Visit: Admit: 2024-05-30 | Payer: Medicaid (Managed Care)

## 2024-05-30 DIAGNOSIS — G43719 Chronic migraine without aura, intractable, without status migrainosus: Principal | ICD-10-CM

## 2024-05-30 MED ORDER — TOPIRAMATE 50 MG TABLET
50 | ORAL_TABLET | Freq: Every day | ORAL | 3 refills | 90.00000 days | Status: AC
Start: 2024-05-30 — End: ?

## 2024-05-30 MED ORDER — RIMEGEPANT 75 MG DISINTEGRATING TABLET
75 | ORAL_TABLET | Freq: Every day | ORAL | 11 refills | 30.00000 days | Status: AC | PRN
Start: 2024-05-30 — End: ?

## 2024-05-30 MED ORDER — FREMANEZUMAB-VFRM 225 MG/1.5 ML SUBCUTANEOUS AUTO-INJECTOR
225 | SUBCUTANEOUS | 12 refills | 30.00000 days | Status: AC
Start: 2024-05-30 — End: ?

## 2024-05-30 NOTE — Progress Notes (Signed)
 Subjective     Patient ID: Mia Brooks is a 29 y.o. female.    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  -Headache frequency: 1/30 days (0 mild-moderate, 1 severe). Prior to fremanezumab  and topiramate  she was having 10 monthly headache days.   -Current treatments: fremanezumab  225 mg/month (helps, no side effects) / topiramate  50 mg/day (helps, causes mild paresthesias) / rimegepant 75mg  ODT daily PRN (helps, no side effects)  -Past treatments: nortriptyline (didn't help) / naratriptan (caused dizziness) / rizatriptan (caused dizziness) / ubrogepant (didn't help)      Histories:     She has a past medical history of Asthma and Headache (10/02/2007).    She has a past surgical history that includes Wisdom tooth extraction (10/01/2012).    Her family history includes Cancer in her maternal grandmother; Hypertension in her father and mother; Migraines in her maternal aunt and mother; Stroke in her paternal grandfather.    She reports that she has never smoked. She has never used smokeless tobacco. She reports current alcohol use of about 2.0 standard drinks of alcohol per week. She reports that she does not use drugs.      Review of Systems   Constitutional:  Negative for fever, chills, fatigue, activity change, appetite change and diaphoresis.   Skin:  Negative for rash, color change, pallor and wound.   HENT:  Negative for facial swelling, neck pain, neck stiffness, ear discharge, hearing loss, ear pain, tinnitus, nosebleeds, congestion, rhinorrhea, postnasal drip, sneezing, sinus pressure, dental problem, drooling, mouth sores, sore throat, trouble swallowing and voice change.    Eyes:  Negative for discharge, itching, pain, redness, photophobia and visual disturbance.   Cardiovascular:  Negative for chest pain, leg swelling and palpitations.    Respiratory:  Negative for shortness of breath, wheezing, apnea, chest tightness, choking, cough and stridor.    Gastrointestinal:  Negative for abdominal pain, vomiting, diarrhea, constipation, blood in stool, abdominal distention, anal bleeding, nausea and rectal pain.   Genitourinary:  Negative for difficulty urinating, dysuria, enuresis, flank pain, frequency and urgency.   Hematologic:  Negative for adenopathy and bruises/bleeds easily.   Musculoskeletal:  Negative for arthralgias, back pain, gait problem, joint swelling and myalgias.   Psychiatric/Behavioral:  Negative for nervous/anxious, agitation, behavioral problems, confusion, decreased concentration, dysphoric mood, hallucinations, hyperactive, self-injury, sleep disturbance and suicidal ideas.    Neurological:  Negative for seizures, syncope, headaches, dizziness, facial asymmetry, light-headedness, numbness, speech difficulty, tremors and weakness.       Allergies:   Peanut    Medications:   Encounter Medications[1]     Patient Reported Not Marked Taking Medications         Disp Refills Start End    fluticasone propionate (FLONASE) 50 mcg/actuation nasal spray -- --  --    Sig: Use 1 spray into each nostril daily. Seasonally    Class: Historical Med    Route: Each Nare          Prescribed Not Marked Taking Medications         Disp Refills Start End    topiramate  (TOPAMAX ) 50 MG tablet 90 tablet 3 05/06/2023 --    Sig: Take 1 tablet (50 mg total) by mouth daily. Indications: Migraine Prevention    Route: Oral    05/06/23 1657 - Original  Entry by Glendia JONETTA Poisson, MD  Authorizing Provider: Glendia JONETTA Poisson, MD               Name:  topiramate  (TOPAMAX ) 50 MG tablet Start date:  05/06/23 End date:  --    Medication:  TOPIRAMATE  50 MG TABLET [[id number]]    Dose:  50 mg Route:  Oral Frequency:  Daily    Sig: Take 1 tablet (50 mg total) by mouth daily. Indications: Migraine Prevention    Instructions:  --                Changes to the order are displayed in red.  Note that  there may be changes made to the order that are not shown in this report, such as changes to details about ingredients.        folic acid  (FOLVITE ) 1 MG tablet 90 tablet 3 05/06/2023 --    Sig: Take 1 tablet (1 mg total) by mouth daily.    Route: Oral    05/06/23 1700 - Original Entry by Glendia JONETTA Poisson, MD  Authorizing Provider: Glendia JONETTA Poisson, MD               Name:  folic acid  (FOLVITE ) 1 MG tablet Start date:  05/06/23 End date:  --    Medication:  FOLIC ACID  1 MG TABLET [[id number]]    Dose:  1 mg Route:  Oral Frequency:  Daily    Sig: Take 1 tablet (1 mg total) by mouth daily.    Instructions:  --                Changes to the order are displayed in red.  Note that there may be changes made to the order that are not shown in this report, such as changes to details about ingredients.        rimegepant 75 mg TbDL 8 tablet 11 05/06/2023 --    Sig: Dissolve 1 tablet (75 mg total) in mouth once daily as needed for migraine.    Notes to Pharmacy: Indications: migraine    Route: Oral    05/06/23 1717 - Modified by Greig Lard, RPh  Authorizing Provider: Glendia JONETTA Poisson, MD               Name:  rimegepant 75 mg TbDL Start date:  05/06/23 End date:  --    Medication:  RIMEGEPANT 75 MG DISINTEGRATING TABLET [[id number]]    Dose:  75 mg Route:  Oral Frequency:  Daily as needed    Sig: Dissolve 1 tablet (75 mg total) in mouth once daily as needed for migraine.    Instructions:  --                05/06/23 1702 - Original Entry by Glendia JONETTA Poisson, MD  Authorizing Provider: Glendia JONETTA Poisson, MD               Name:  rimegepant 75 mg TbDL Start date:  05/06/23 End date:  --    Medication:  RIMEGEPANT 75 MG DISINTEGRATING TABLET [[id number]]    Dose:  75 mg Route:  Oral Frequency:  Daily as needed    Sig: Take 1 tablet (75 mg total) by mouth daily as needed. Indications: migraine    Instructions:  --                Changes to the order are displayed in red.  Note that there may be changes made to the order that  are not shown in this report, such as changes to details  about ingredients.        fremanezumab -vfrm 225 mg/1.5 mL AtIn 1.5 mL 12 05/09/2023 --    Sig: Inject the contents of 1 pen (1.5 mLs, 225 mg total) subcutaneously every 28 days.    Notes to Pharmacy: Indications: Migraine Prevention    Route: Subcutaneous    05/09/23 1310 - Modified by Greig Lard, RPh  Authorizing Provider: Glendia JONETTA Poisson, MD               Name:  fremanezumab -vfrm 225 mg/1.5 mL AtIn Start date:  05/09/23 End date:  --    Medication:  FREMANEZUMAB -VFRM 225 MG/1.5 ML SUBCUTANEOUS AUTO-INJECTOR [[id number]]    Dose:  225 mg Route:  Subcutaneous Frequency:  Every 28 days    Sig: Inject the contents of 1 pen (1.5 mLs, 225 mg total) subcutaneously every 28 days.    Instructions:  --                05/09/23 1303 - Original Entry by Glendia JONETTA Poisson, MD  Authorizing Provider: Glendia JONETTA Poisson, MD               Name:  fremanezumab -vfrm 225 mg/1.5 mL AtIn Start date:  05/09/23 End date:  --    Medication:  FREMANEZUMAB -VFRM 225 MG/1.5 ML SUBCUTANEOUS AUTO-INJECTOR [[id number]]    Dose:  225 mg Route:  Subcutaneous Frequency:  Every 28 days    Sig: Inject 1.5 mLs (225 mg total) subcutaneously every 28 days. Indications: Migraine Prevention    Instructions:  --                Changes to the order are displayed in red.  Note that there may be changes made to the order that are not shown in this report, such as changes to details about ingredients.        ondansetron  (ZOFRAN -ODT) 4 MG disintegrating tablet 30 tablet 5 06/16/2023 --    Sig: Take 1 tablet (4 mg total) by mouth every 8 hours as needed for Nausea.    Route: Oral    06/16/23 1727 - Original Entry by Glendia JONETTA Poisson, MD  Authorizing Provider: Glendia JONETTA Poisson, MD               Name:  ondansetron  (ZOFRAN -ODT) 4 MG disintegrating tablet Start date:  06/16/23 End date:  --    Medication:  ONDANSETRON  4 MG DISINTEGRATING TABLET [[id number]]    Dose:  4 mg Route:  Oral Frequency:  Every 8 hours PRN    Sig: Take 1 tablet (4 mg total) by mouth every 8 hours as needed for Nausea.    Instructions:   --                Changes to the order are displayed in red.  Note that there may be changes made to the order that are not shown in this report, such as changes to details about ingredients.                            Objective     Blood pressure 131/83, pulse 66, resp. rate 20, SpO2 97%.    Neurological Exam    Physical Exam    Prior Diagnostic Testing:          Assessment     ASSESSMENT :  Migraine with aura  PLAN:  -doing well  -continue  current therapies (see HPI)    RTC 1 year      Number and Complexity of Problems (Level 3)  During this encounter, I addressed 1 stable chronic illness      Amount and/or Complexity of Data Ordered, Reviewed, or Analyzed (Level 2)  I reviewed minimal or no data.      Risk of Complication and/or Morbidity or Mortality of Patient Management (Level  4)  The patient's management entails Moderate risk of complications and/or morbidity or mortality.    Overall LOS:  3         Answers submitted by the patient for this visit:  Established Neurology Visit on 05/30/2024  8:00 AM with Glendia Poisson, MD  Review of Systems (Submitted on 05/28/2024)  Unexpected Weight Change: No  Cold Intolerance: No  Heat Intolerance: No  Polydipsia (excessive thirst): No  Polyphagia (increased appetite): No  Polyuria (frequent urination): No  Genital sore: No  Hematuria (blood in urine): No  Urine decreased: No  Environmental allergies: Yes  Food Allergies : Yes  Immunocompromised: No         [1]   Outpatient Encounter Medications as of 05/30/2024   Medication Sig Dispense Refill    fluticasone propionate (FLONASE) 50 mcg/actuation nasal spray Use 1 spray into each nostril daily. Seasonally      folic acid  (FOLVITE ) 1 MG tablet Take 1 tablet (1 mg total) by mouth daily. 90 tablet 3    fremanezumab -vfrm 225 mg/1.5 mL AtIn Inject the contents of 1 pen (1.5 mLs, 225 mg total) subcutaneously every 28 days. 1.5 mL 12    ondansetron  (ZOFRAN -ODT) 4 MG disintegrating tablet Take 1 tablet (4 mg total) by mouth every 8  hours as needed for Nausea. 30 tablet 5    rimegepant 75 mg TbDL Dissolve 1 tablet (75 mg total) in mouth once daily as needed for migraine. 8 tablet 11    topiramate  (TOPAMAX ) 50 MG tablet Take 1 tablet (50 mg total) by mouth daily. Indications: Migraine Prevention 90 tablet 3     No facility-administered encounter medications on file as of 05/30/2024.
